# Patient Record
Sex: Male | Born: 1960 | Race: White | Hispanic: No | Marital: Married | Smoking: Never smoker
Health system: Southern US, Community
[De-identification: ages and names within clinical notes are randomized; demographics above are authoritative.]

## PROBLEM LIST (undated history)

## (undated) DIAGNOSIS — K5090 Crohn's disease, unspecified, without complications: Secondary | ICD-10-CM

## (undated) DIAGNOSIS — Z8739 Personal history of other diseases of the musculoskeletal system and connective tissue: Secondary | ICD-10-CM

## (undated) HISTORY — PX: ILEOSTOMY: SHX1783

## (undated) HISTORY — DX: Crohn's disease, unspecified, without complications: K50.90

## (undated) HISTORY — PX: HERNIA REPAIR: SHX51

## (undated) HISTORY — PX: OTHER SURGICAL HISTORY: SHX169

## (undated) HISTORY — PX: VASECTOMY: SHX75

---

## 2011-01-31 HISTORY — PX: COLONOSCOPY: SHX174

## 2012-01-31 HISTORY — PX: FLEXIBLE SIGMOIDOSCOPY: SHX1649

## 2015-01-29 ENCOUNTER — Encounter (INDEPENDENT_AMBULATORY_CARE_PROVIDER_SITE_OTHER): Payer: Self-pay

## 2015-02-03 ENCOUNTER — Ambulatory Visit (INDEPENDENT_AMBULATORY_CARE_PROVIDER_SITE_OTHER): Payer: Federal, State, Local not specified - PPO | Admitting: Internal Medicine

## 2015-02-03 ENCOUNTER — Encounter (INDEPENDENT_AMBULATORY_CARE_PROVIDER_SITE_OTHER): Payer: Self-pay | Admitting: Internal Medicine

## 2015-02-03 VITALS — BP 106/70 | HR 68 | Temp 98.6°F | Resp 18 | Ht 74.0 in | Wt 222.8 lb

## 2015-02-03 DIAGNOSIS — B719 Cestode infection, unspecified: Secondary | ICD-10-CM | POA: Insufficient documentation

## 2015-02-03 DIAGNOSIS — K5010 Crohn's disease of large intestine without complications: Secondary | ICD-10-CM | POA: Insufficient documentation

## 2015-02-03 DIAGNOSIS — K50113 Crohn's disease of large intestine with fistula: Secondary | ICD-10-CM

## 2015-02-03 DIAGNOSIS — A071 Giardiasis [lambliasis]: Secondary | ICD-10-CM | POA: Insufficient documentation

## 2015-02-03 NOTE — Progress Notes (Signed)
Presenting complaint;  For evaluation of recurrent Giardia and tapeworm infestation. History of Crohn's colitis.  History of present illness  Patient is 55 year old Caucasian male who is being evaluated at request of his wife [name] who is an Therapist, sports at [employer]. Patient's primary care physician is Dr. Anastasio Champion. History is provided by the patient and his wife and updated by reviewing EPIC records from tertiary centers. He was able to complete the stool studies as recommended and these are reviewed under lab data.  Patient's present illness began in 2004 when he developed bloody diarrhea and underwent flexible sigmoidoscopy(Dr. Nyoka Cowden in [facility]) and was diagnosed with ulcerative colitis. He was treated with prednisone and oral mesalamine. Prednisone was tapered. He did well for about 2 years when he had to be retreated with prednisone and oral mesalamine(Asacol). His symptoms could not be controlled. Somewhere along the way he developed perianal fistulae and felt to have Crohn's colitis. He was therefore referred to Dr. [name] of [facility]. He was begun on Imuran but within couple of days he developed generalized arthralgias and medication was discontinued. He was retreated with prednisone Cipro and Flagyl. He was also briefly seen by me about 7 years ago while I was in [facility] and was reluctant to be treated with biologics He was finally referred to Dr. [name] of [facility] in August 2010. He underwent colonoscopy on 10/06/2008 revealing normal terminal ileum and patchy colonic disease as well as pseudopolyps and noncritical stricture in ascending colon. Biopsy showed changes consistent with Crohn's disease. He was begun on Remicade and fusion at a dose of 5 mg/kg. Remicade was continued through April 2012 when he developed antibodies and he was switched to Humira. He he recalls that perianal fistulae never healed although symptomatically he was much better. He  underwent flexible sigmoidoscopy in March 2014 and noted to have very active disease. He was retreated with prednisone. Dr. Emelda Fear recommended Vedolizumab but he decided not to go back. Last year he was diagnosed with tapeworm infestation as well as intestinal giardiasis and was treated with praziquantel and albendazole and he also received Tinidazole. He felt much better. Stool frequency decreased. About 2 months ago he started to feel tired in stool frequency increased. He did not experience abdominal pain rectal bleeding fever or chills. When this appointment was made patient was advised to proceed with stool studies for O&P. In the meantime he has been taking herbal medications. And he ordered he feels better. He is having 3-4 bowel movements per day. Perianal fistulae have not healed but drainage has decreased and he has less pain and induration and perianal region. They have dog as a pet and he was last tested 6 months ago. Family history is negative for inflammatory bowel disease. He is on multiple herbal medications and list is not complete. And has not lost any weight recently. He recalls that when he was on vacation in [place] were 10 years ago he became acutely ill and lost 60 pounds in 2 months and wonders if he acquired parasitic infestation then.    Current Medications: Outpatient Encounter Prescriptions as of 02/03/2015  Medication Sig  . Cholecalciferol 2000 units CAPS   . Lactobacillus (PROBIOTIC ACIDOPHILUS PO) Take by mouth daily.  . Omega-3 Fatty Acids (FISH OIL) 1000 MG CAPS 1,000 mg every day Take one capsule by mouth daily   No facility-administered encounter medications on file as of 02/03/2015.   Past medical history:  History of inflammatory bowel disease as  above. History of intestinal giardiasis and tapeworm infection as above. Surgery on right thumb for fracture in 2004 Bilateral vasectomy in 2004 Bilateral inguinal herniorrhaphy in 2012.   Allergies: Allergies   Allergen Reactions  . Imuran [Azathioprine] Swelling    Join pain , rash  . Remicade [Infliximab] Anaphylaxis    Joint pain  . Ppd [Tuberculin Purified Protein Derivative] Swelling    Joint pain. Patient rec'd both this and Imuran, not sure which caused this.     Family history:  Father died of melanoma at age 36 within 1 year of diagnosis. Mother died of lymphoma at age 7 within 2 years of diagnosis. He lost one brother of lung carcinoma at age 27. He has one brother living with 55 and in good health other than being overweight.   Social history:  He is married and have 3 healthy children ages 31, 12 and 59. He works in [employer]. He does not smoke cigarettes or drink alcohol.     Physical examination: Blood pressure 106/70, pulse 68, temperature 98.6 F (37 C), temperature source Oral, resp. rate 18, height 6' 2"  (1.88 m), weight 222 lb 12.8 oz (101.061 kg). Patient is alert and in no acute distress. Conjunctiva is pink. Sclera is nonicteric Oropharyngeal mucosa is normal. No neck masses or thyromegaly noted. Cardiac exam with regular rhythm normal S1 and S2. No murmur or gallop noted. Lungs are clear to auscultation. Abdomen is symmetrical. Bowel sounds are normal. On palpation abdomen is soft and nontender. Sigmoid colon is palpable. No hepatosplenomegaly or masses. Rectal examination was limited to external inspection. There is induration to perianal region to the left of anal opening along with 3 perianal fistula. One is open and the other to work covered with granulation tissue. No LE edema or clubbing noted.  Labs/studies Results: Lab data from 01/20/2015  WBC 7.0  H&H 13.3 and 41.2  Platelet count 391K  Sedimentation rate 33(0-20). Electrolytes within normal limits. BUN 12, creatinine 0.98.  Glucose 118. Bilirubin 0.95, AP 120, AST 20, ALT 20, total protein 8.0 and albumin 3.8.  Serum calcium 9.1.  CRP 3.7(normal less than 1 )  Stool studies  from 01/22/2015(performed at [facility]).  Positive for Giardia and tapeworms.    Assessment:  Cory [name] has to GI issues which are as follows:  #1. Fistula rising Crohn's colitis. He has perianal fistula which have not healed with therapy. He was on Remicade or infliximab for over 3 years and then Humira or adalimumab for over a year. Based on his symptoms is disease activity would appear to be mild to moderate. It remains to be seen if parasitic infestation with 2 organisms considered to be pathogens providing some benefit as for as inflammatory bowel disease is concerned. Treatment options are limited but first need to eradicate both parasites from his GI tract.  #2. Intestinal giardiasis along with tapeworm infestation. Hernia therapy with Helminthic agents did not eradicate these infections. He is presently on herbal combination and appears to be doing better. Since he is doing better will lessen continues therapy for total of 3 cycles and determine if he needs to be retreated with other agents such as Nitazonaxide and metronidazole. Given that he has to parasites in his GI tract one has to wonder about impaired cellular immunity. Will also check HIV with next blood draw.   Plan:  Patient will continue herbal cocktail for total of 3 cycles(10 days on and 5 days off) At which time stool  studies will be repeated and if he still has parasitic infection he will be treated with antibiotics as above. Will also confer with ID specialist with interest in parasitic infestations at tertiary center. Will consider diagnostic colonoscopy as soon as parasitic infestation treated successfully. HIV screening with next blood draw. Patient also advised to get his dog tested for parasitic infestation. Office visit in 2 months.

## 2015-02-03 NOTE — Patient Instructions (Signed)
Continue herbal medications as discussed. Notify if diarrhea relapses or you have abdominal pain or fever.

## 2015-02-04 ENCOUNTER — Encounter (INDEPENDENT_AMBULATORY_CARE_PROVIDER_SITE_OTHER): Payer: Self-pay

## 2015-02-12 ENCOUNTER — Telehealth (INDEPENDENT_AMBULATORY_CARE_PROVIDER_SITE_OTHER): Payer: Self-pay | Admitting: Internal Medicine

## 2015-02-12 NOTE — Telephone Encounter (Signed)
I called microbiology lab in [facility]: Patient's overall and parasite results. It turns out that stool test is negative for ova and parasites. Results given to patient. Patient informed that he does not have parasitic infection. Would recommend proceeding with colonoscopy but willing to treat him with metronidazole which would help with fistulous disease. He will let us know.

## 2015-02-15 ENCOUNTER — Telehealth (INDEPENDENT_AMBULATORY_CARE_PROVIDER_SITE_OTHER): Payer: Self-pay | Admitting: Internal Medicine

## 2015-02-15 NOTE — Telephone Encounter (Signed)
Patient has called , would like to precede with the  Flagyl. Please advise the sig and # to be dispensed and any refills.

## 2015-02-15 NOTE — Telephone Encounter (Signed)
Mrs. Marken left a message saying the pt wants to proceed with having a flagile (sp?) treatment. She'd like a Rx [facility] to [facility] [place] on [address]. Please give them a call if needed.  Thank you.

## 2015-02-16 ENCOUNTER — Other Ambulatory Visit (INDEPENDENT_AMBULATORY_CARE_PROVIDER_SITE_OTHER): Payer: Self-pay | Admitting: *Deleted

## 2015-02-16 MED ORDER — METRONIDAZOLE 250 MG PO TABS: 250.0000 mg | ORAL_TABLET | Freq: Three times a day (TID) | ORAL | Status: DC

## 2015-02-16 NOTE — Telephone Encounter (Signed)
Please call prescription for 250 mg by mouth 3 times a day for 1 month with 1 refill. Office visit in one month

## 2015-02-16 NOTE — Telephone Encounter (Signed)
Flagyl 250 mg has been e-scribed to the patient's pharmacy per Dr.Rehman. Patient will need an OV with NUR in 1 month.  Patient may already have one.

## 2015-02-16 NOTE — Telephone Encounter (Signed)
Has OV 03/30/15

## 2015-02-16 NOTE — Telephone Encounter (Signed)
Per Dr.Rehman call in prescription for the patient. This has been e-scribed to the patient's pharmacy.

## 2015-02-19 ENCOUNTER — Encounter (INDEPENDENT_AMBULATORY_CARE_PROVIDER_SITE_OTHER): Payer: Self-pay

## 2015-03-30 ENCOUNTER — Ambulatory Visit (INDEPENDENT_AMBULATORY_CARE_PROVIDER_SITE_OTHER): Payer: Federal, State, Local not specified - PPO | Admitting: Internal Medicine

## 2015-03-30 ENCOUNTER — Encounter (INDEPENDENT_AMBULATORY_CARE_PROVIDER_SITE_OTHER): Payer: Self-pay | Admitting: Internal Medicine

## 2015-03-30 VITALS — BP 102/80 | HR 70 | Temp 98.3°F | Resp 18 | Ht 74.0 in | Wt 223.3 lb

## 2015-03-30 DIAGNOSIS — Z8619 Personal history of other infectious and parasitic diseases: Secondary | ICD-10-CM

## 2015-03-30 DIAGNOSIS — K50113 Crohn's disease of large intestine with fistula: Secondary | ICD-10-CM

## 2015-03-30 NOTE — Progress Notes (Signed)
Presenting complaint;  Follow-up for Crohn's colitis. History of parasitic infestation.  Subjective:  Cory [name]'s 55 year old Caucasian male whose effort his last visit was on 02/03/2015. Follow-up stool tests were negative for ova and parasites. He finished up 30 days of Flagyl and he also took cocktail of herbal medications. He states he felt much better while he was in herbal therapy. He had more energy less diarrhea and slept better. He did not see any benefit with metronidazole. He is having 3-4 stools per day. Most of her stools are loose and this in pieces. He has occasional formed stool. He denies abdominal pain melena or rectal bleeding. His appetite is good. He has scant amount of drainage from perianal fistulae. He has not lost any weight since his last visit. He had stool testing done by family vet and was told it was negative for Giardia but he still has tape formed larvae. His wife feels his disease is secondary to persistent parasitic infestation. She has been tested and was negative.     Current Medications: Outpatient Encounter Prescriptions as of 03/30/2015  Medication Sig  . Cholecalciferol 2000 units CAPS Take 2,000 capsules by mouth daily.   . Lactobacillus (PROBIOTIC ACIDOPHILUS PO) Take by mouth daily.  Marland Kitchen OLIVE LEAF EXTRACT PO Take by mouth daily.  . Omega-3 Fatty Acids (FISH OIL) 1000 MG CAPS 1,000 mg every day Take one capsule by mouth daily  . [DISCONTINUED] metroNIDAZOLE (FLAGYL) 250 MG tablet Take 1 tablet (250 mg total) by mouth 3 (three) times daily. (Patient not taking: Reported on 03/30/2015)   No facility-administered encounter medications on file as of 03/30/2015.     Objective: Blood pressure 102/80, pulse 70, temperature 98.3 F (36.8 C), temperature source Oral, resp. rate 18, height 6' 2"  (1.88 m), weight 223 lb 4.8 oz (101.288 kg). Patient is alert and in no acute distress. Conjunctiva is pink. Sclera is nonicteric Oropharyngeal mucosa is normal. No  neck masses or thyromegaly noted. Cardiac exam with regular rhythm normal S1 and S2. No murmur or gallop noted. Lungs are clear to auscultation. Abdomen is symmetrical soft and nontender without organomegaly or masses. Rectal examination was limited to external inspection. He has indurated area to the left of anal opening knee has 4 perianal openings with granulation tissue. There is a mucopurulent discharge on the left side. No fluctuant mass palpated. No LE edema or clubbing noted.  Labs/studies Results: Stool test from 2015/01/23  Negative for over and parasites.    Assessment:  #1. Fistulas and Crohn's colitis. He remains with mild to moderate symptoms. He has failed multiple treatment modalities including oral mesalamine, 6-MP and 2 biologics. I do not believe his illness can be explained on the basis of parasitic infestation. Patient needs to undergo diagnostic colonoscopy before another biologic considered. #2. History of Giardia and tapeworm infestation. Stool test was negative at [facility] lab. However repeat stool testing by his dogs vet is positive for tapeworm larvae and Giardia. I do not believe he has recurrent Helminthic infestation. Will repeat stool test guaiac [facility] lab. Since patient and his wife are convinced that he has persistent parasitic infestation he may have to be referred to yet another tertiary center.   Plan:  Stool for O&P via [facility] lab.

## 2015-03-30 NOTE — Patient Instructions (Signed)
Physician will call with results of stool studies when completed

## 2015-03-31 ENCOUNTER — Encounter (INDEPENDENT_AMBULATORY_CARE_PROVIDER_SITE_OTHER): Payer: Self-pay | Admitting: Internal Medicine

## 2015-04-09 ENCOUNTER — Other Ambulatory Visit (HOSPITAL_COMMUNITY)
Admission: RE | Admit: 2015-04-09 | Discharge: 2015-04-09 | Disposition: A | Discharge status: Home / Self Care | Payer: Federal, State, Local not specified - PPO | Source: Ambulatory Visit | Attending: Internal Medicine | Admitting: Internal Medicine

## 2015-04-09 DIAGNOSIS — Z8619 Personal history of other infectious and parasitic diseases: Secondary | ICD-10-CM | POA: Diagnosis not present

## 2015-04-28 ENCOUNTER — Telehealth (INDEPENDENT_AMBULATORY_CARE_PROVIDER_SITE_OTHER): Payer: Self-pay | Admitting: Internal Medicine

## 2015-04-28 NOTE — Telephone Encounter (Signed)
Patient was called and advised that the specimen is in process per the lab (epic).

## 2015-04-28 NOTE — Telephone Encounter (Signed)
[name], Mr. Hogans's wife left a message saying he's not received the results to the stool sample that was dropped off two weeks ago and she's wondering if he can get them. In the message she mentioned dropping the sample off at the hospital and I'm not sure if she was confused between the hospital and bringing it to the office. Please call the patient if possible.   Pt's ph# [phone]  Thank you.

## 2015-05-04 LAB — OVA + PARASITE EXAM

## 2015-05-12 ENCOUNTER — Encounter (INDEPENDENT_AMBULATORY_CARE_PROVIDER_SITE_OTHER): Payer: Self-pay

## 2015-06-17 ENCOUNTER — Other Ambulatory Visit (INDEPENDENT_AMBULATORY_CARE_PROVIDER_SITE_OTHER): Payer: Self-pay | Admitting: Internal Medicine

## 2015-06-17 MED ORDER — METRONIDAZOLE 500 MG PO TABS: 500.0000 mg | ORAL_TABLET | Freq: Three times a day (TID) | ORAL | Status: DC

## 2015-07-13 ENCOUNTER — Encounter (INDEPENDENT_AMBULATORY_CARE_PROVIDER_SITE_OTHER): Payer: Self-pay | Admitting: Internal Medicine

## 2015-07-13 ENCOUNTER — Ambulatory Visit (INDEPENDENT_AMBULATORY_CARE_PROVIDER_SITE_OTHER): Payer: Federal, State, Local not specified - PPO | Admitting: Internal Medicine

## 2015-07-13 VITALS — BP 108/70 | HR 72 | Temp 98.4°F | Resp 18 | Ht 74.0 in | Wt 217.9 lb

## 2015-07-13 DIAGNOSIS — K50113 Crohn's disease of large intestine with fistula: Secondary | ICD-10-CM

## 2015-07-13 DIAGNOSIS — J4530 Mild persistent asthma, uncomplicated: Secondary | ICD-10-CM

## 2015-07-13 MED ORDER — METHYLPREDNISOLONE 4 MG PO TBPK: ORAL_TABLET | ORAL | Status: DC

## 2015-07-13 NOTE — Progress Notes (Signed)
Presenting complaint;  Follow-up for fistulizing Crohn's disease.  Subjective:  Cory [name] is 55 year old Caucasian male who is here for scheduled visit complete by his wife Cory [name]. He was last seen on 1 03/30/2015. He had stool O&P and was negative. He was called in metronidazole prescription last week which he took for 2 weeks and noted decrease in drainage from perianal fistulae. However his symptoms relapse every few weeks. He develops a boil and he gets relief for any ruptures and this cycle carries on. He is having 3-4 stools per day. Most of his stools are soft. Some stools are loose. He denies frank bleeding or melena. He also denies abdominal pain fever or chills. He has good appetite. He has lost 6 pounds since his last visit. He states he is changes eating habits when he found out his LDL was 171. He complains of dry hacking cough of 3 weeks' duration. He was seen in [facility] and given doxycycline. However he is not feeling any better. He denies heartburn dysphagia or regurgitation. 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 He works with [employer].   Current Medications: Outpatient Encounter Prescriptions as of 07/13/2015  Medication Sig  . Cholecalciferol 2000 units CAPS Take 5,000 capsules by mouth daily.   Marland Kitchen doxycycline (VIBRA-TABS) 100 MG tablet TAKE 1 TABLET BY MOUTH TWICE A DAY FOR 14 DAYS  . fluticasone (FLONASE) 50 MCG/ACT nasal spray Place into both nostrils daily.   . Lactobacillus (PROBIOTIC ACIDOPHILUS PO) Take by mouth daily.  . Omega-3 Fatty Acids (FISH OIL) 1000 MG CAPS 1,000 mg every day Take one capsule by mouth daily  . metroNIDAZOLE (FLAGYL) 500 MG tablet Take 1 tablet (500 mg total) by mouth 3 (three) times daily. (Patient not taking: Reported on 07/13/2015)  . OLIVE LEAF EXTRACT PO Take by mouth daily. Reported on 07/13/2015   No facility-administered encounter medications on file as of 07/13/2015.      Objective: Blood pressure 108/70, pulse 72, temperature 98.4 F (36.9 C), temperature source Oral, resp. rate 18, height 6' 2"  (1.88 m), weight 217 lb 14.4 oz (98.839 kg). Patient is alert and in no acute distress. Conjunctiva is pink. Sclera is nonicteric Oropharyngeal mucosa is normal. No neck masses or thyromegaly noted. Cardiac exam with regular rhythm normal S1 and S2. No murmur or gallop noted. Lungs are clear to auscultation. Abdomen is symmetrical soft and nontender without organomegaly or masses.  No LE edema or clubbing noted.  Labs/studies Results:   stool O&P report to be negative on 04/16/2015.   Assessment:  #1. Fistulizing Crohn's disease. He remains symptomatic but luckily he is coping well with the symptoms. Two week course of metronidazole provided transient relief. He has been on multiple therapies and unfortunately none have provided sustained benefit. #2. Cough of 3 weeks duration possibly due to allergic bronchitis. He has not responded to doxycycline. #3. History of parasitic infestation. Stool O&P negative from March 2017.   Plan:  Discontinue doxycycline. Medrol dose pack as directed. Claritin or Zyrtec 10 mg by mouth daily. MR enterography to determine disease extent and outline fistulae. Will schedule diagnostic colonoscopy once MR completed and reviewed.

## 2015-07-13 NOTE — Patient Instructions (Signed)
Discontinue doxycycline. MR enterography to be scheduled. Patient will call with results when study completed

## 2015-07-15 ENCOUNTER — Other Ambulatory Visit (INDEPENDENT_AMBULATORY_CARE_PROVIDER_SITE_OTHER): Payer: Self-pay | Admitting: Internal Medicine

## 2015-07-15 DIAGNOSIS — K5010 Crohn's disease of large intestine without complications: Secondary | ICD-10-CM

## 2015-07-22 ENCOUNTER — Ambulatory Visit (HOSPITAL_COMMUNITY): Admission: RE | Admit: 2015-07-22 | Payer: Federal, State, Local not specified - PPO | Source: Ambulatory Visit

## 2015-07-26 ENCOUNTER — Ambulatory Visit (HOSPITAL_COMMUNITY)
Admission: RE | Admit: 2015-07-26 | Discharge: 2015-07-26 | Disposition: A | Discharge status: Home / Self Care | Payer: Federal, State, Local not specified - PPO | Source: Ambulatory Visit | Attending: Internal Medicine | Admitting: Internal Medicine

## 2015-07-26 DIAGNOSIS — K50113 Crohn's disease of large intestine with fistula: Secondary | ICD-10-CM

## 2015-07-26 DIAGNOSIS — K5010 Crohn's disease of large intestine without complications: Secondary | ICD-10-CM | POA: Diagnosis not present

## 2015-07-26 MED ORDER — GLUCAGON HCL RDNA (DIAGNOSTIC) 1 MG IJ SOLR
INTRAMUSCULAR | Status: AC
  Administered 2015-07-26: 0.5 mg via INTRAVENOUS
  Filled 2015-07-26: qty 1

## 2015-07-26 MED ORDER — BARIUM SULFATE 0.1 % PO SUSP
ORAL | Status: AC
  Filled 2015-07-26: qty 3

## 2015-07-26 MED ORDER — GLUCAGON HCL RDNA (DIAGNOSTIC) 1 MG IJ SOLR
0.5000 mg | Freq: Once | INTRAMUSCULAR | Status: AC
  Administered 2015-07-26: 0.5 mg via INTRAVENOUS

## 2015-07-26 MED ORDER — GLUCAGON HCL RDNA (DIAGNOSTIC) 1 MG IJ SOLR: 0.5000 mg | Freq: Once | INTRAMUSCULAR | Status: DC

## 2015-07-26 MED ORDER — GADOBENATE DIMEGLUMINE 529 MG/ML IV SOLN
20.0000 mL | Freq: Once | INTRAVENOUS | Status: AC | PRN
  Administered 2015-07-26: 20 mL via INTRAVENOUS

## 2015-07-28 MED ORDER — GADOBENATE DIMEGLUMINE 529 MG/ML IV SOLN: 20.0000 mL | Freq: Once | INTRAVENOUS | Status: DC | PRN

## 2015-07-29 ENCOUNTER — Telehealth (INDEPENDENT_AMBULATORY_CARE_PROVIDER_SITE_OTHER): Payer: Self-pay | Admitting: Internal Medicine

## 2015-07-29 NOTE — Telephone Encounter (Signed)
Patient's spouse called to see if we have the patient's MRI results back.  [phone]

## 2015-07-29 NOTE — Telephone Encounter (Signed)
I have called the patient's wife at the number provided. Rec'd voicemail. Left message asking that she call me back.

## 2015-08-04 ENCOUNTER — Other Ambulatory Visit (INDEPENDENT_AMBULATORY_CARE_PROVIDER_SITE_OTHER): Payer: Self-pay | Admitting: *Deleted

## 2015-08-04 ENCOUNTER — Encounter (INDEPENDENT_AMBULATORY_CARE_PROVIDER_SITE_OTHER): Payer: Self-pay | Admitting: *Deleted

## 2015-08-04 DIAGNOSIS — K50919 Crohn's disease, unspecified, with unspecified complications: Secondary | ICD-10-CM

## 2015-08-04 NOTE — Telephone Encounter (Signed)
Patient needs trilyte 

## 2015-08-05 MED ORDER — PEG 3350-KCL-NA BICARB-NACL 420 G PO SOLR: 4000.0000 mL | Freq: Once | ORAL | Status: DC

## 2015-08-09 ENCOUNTER — Telehealth (INDEPENDENT_AMBULATORY_CARE_PROVIDER_SITE_OTHER): Payer: Self-pay | Admitting: Internal Medicine

## 2015-08-09 ENCOUNTER — Other Ambulatory Visit (INDEPENDENT_AMBULATORY_CARE_PROVIDER_SITE_OTHER): Payer: Self-pay | Admitting: Internal Medicine

## 2015-08-09 ENCOUNTER — Other Ambulatory Visit (HOSPITAL_COMMUNITY)
Admission: RE | Admit: 2015-08-09 | Discharge: 2015-08-09 | Disposition: A | Discharge status: Home / Self Care | Payer: Federal, State, Local not specified - PPO | Source: Ambulatory Visit | Attending: Internal Medicine | Admitting: Internal Medicine

## 2015-08-09 DIAGNOSIS — R197 Diarrhea, unspecified: Secondary | ICD-10-CM

## 2015-08-09 LAB — C DIFFICILE QUICK SCREEN W PCR REFLEX
C DIFFICLE (CDIFF) ANTIGEN: NEGATIVE
C Diff interpretation: NOT DETECTED
C Diff toxin: NEGATIVE

## 2015-08-09 NOTE — Telephone Encounter (Signed)
Patient's wife called and stated that she spoke to Dr. Laural Golden on Saturday and that Dr. Laural Golden called [facility] in some medication, Cipro and Flagyl.  She stated that she failed to tell Dr. Laural Golden that he had been on antibiotics for pneumonia.  He is now having diarrhea.  She is very concerned that he may have c-diff.  She is working in the ER today and brought a sample with her.  She would like to know if he needs to be seen or if an order can be given to test the sample.  [name] - ER [phone] [place] [place] [phone]

## 2015-08-09 NOTE — Telephone Encounter (Signed)
Please send order for cdiff and let patient's wife know

## 2015-08-09 NOTE — Telephone Encounter (Signed)
Patient wife aware

## 2015-08-09 NOTE — Telephone Encounter (Signed)
Cory [name] has addressed this

## 2015-08-19 ENCOUNTER — Encounter (INDEPENDENT_AMBULATORY_CARE_PROVIDER_SITE_OTHER): Payer: Self-pay | Admitting: Internal Medicine

## 2015-08-19 ENCOUNTER — Ambulatory Visit (INDEPENDENT_AMBULATORY_CARE_PROVIDER_SITE_OTHER): Payer: Federal, State, Local not specified - PPO | Admitting: Internal Medicine

## 2015-08-19 VITALS — BP 124/86 | HR 72 | Temp 98.0°F | Ht 74.0 in | Wt 207.6 lb

## 2015-08-19 DIAGNOSIS — K50919 Crohn's disease, unspecified, with unspecified complications: Secondary | ICD-10-CM | POA: Diagnosis not present

## 2015-08-19 NOTE — Progress Notes (Addendum)
Subjective:    Patient ID: Cory [name], male    DOB: 1960/07/18, 55 y.o.   MRN: [id number]  HPIHere today with c/o diarrhea. He is having diarrhea about 5 times a day. He says the diarrhea looks like a milk shake. Diarrhea started 2 weeks ago after Rocephin IV and ?Cefdinir at a Urgent Care. 08/09/2015 C diff negative. He says he feels weak. No fever. He says he had a fever last weekend. He is trying to eat. He says he has lost weight. He has lost 10 pounds since his OV in June.  He was last seen by Dr Laural Golden in June. Stool O and P were negative. He had a negative MRI. Hx of perianal fistulae. Placed on Cipro and Flagyl and Nystatin cream by Dr. Laural Golden. Scheduled for a colonoscopy 09/03/2015.       Diagnosed with UC by Dr. Nyoka Cowden in [place] in 2004.  He was treated with Prednisone and oral mesalamine. He developed a perianal fistulae and he was felt to have Crohn's collitis.  He was referred to Dr. [name] at [facility]. He was started on Imura burt developed arthralgias and the medication was discontinued.  Referred to Dr. [name] of [facility] in August 2010. He underwent colonoscopy on 10/06/2008 revealing normal terminal ileum and patchy colonic disease as well as pseudopolyps and noncritical stricture in ascending colon. Biopsy showed changes consistent with Crohn's disease. He was begun on Remicade and fusion at a dose of 5 mg/kg. Remicade was continued through April 2012 when he developed antibodies and he was switched to Humira. He he recalls that perianal fistulae never healed although symptomatically he was much better. He underwent flexible sigmoidoscopy in March 2014 and noted to have very active disease. He was retreated with prednisone. Dr. Emelda Fear recommended Vedolizumab but he decided not to go back. Last year he was diagnosed with tapeworm infestation as well as intestinal giardiasis and was treated with praziquantel and albendazole and he also received Tinidazole. He  felt much better. Stool frequency decreased. (Per Dr. Olevia Perches records).   Review of Systems Past Medical History  Diagnosis Date  . Crohn's disease [facility])     Past Surgical History  Procedure Laterality Date  . Right thumb      Compound Surgery  . Vasectomy    . Hernia repair      bilateral with mesh  . Colonoscopy  2013  . Flexible sigmoidoscopy  01/2012    Allergies  Allergen Reactions  . Imuran [Azathioprine] Swelling    Join pain , rash  . Remicade [Infliximab] Anaphylaxis    Joint pain  . Ppd [Tuberculin Purified Protein Derivative] Swelling    Joint pain. Patient rec'd both this and Imuran, not sure which caused this.    Current Outpatient Prescriptions on File Prior to Visit  Medication Sig Dispense Refill  . Cholecalciferol 2000 units CAPS Take 5,000 capsules by mouth daily.     . Lactobacillus (PROBIOTIC ACIDOPHILUS PO) Take by mouth daily.    . metroNIDAZOLE (FLAGYL) 500 MG tablet Take 1 tablet (500 mg total) by mouth 3 (three) times daily. 42 tablet 1  . fluticasone (FLONASE) 50 MCG/ACT nasal spray Place into both nostrils daily. Reported on 08/19/2015  0  . methylPREDNISolone (MEDROL DOSEPAK) 4 MG TBPK tablet Use as directed (Patient not taking: Reported on 08/19/2015) 21 tablet 0  . OLIVE LEAF EXTRACT PO Take by mouth daily. Reported on 08/19/2015    . Omega-3 Fatty Acids (FISH OIL) 1000 MG  CAPS Reported on 08/19/2015    . polyethylene glycol-electrolytes (TRILYTE) 420 g solution Take 4,000 mLs by mouth once. (Patient not taking: Reported on 08/19/2015) 4000 mL 0   Current Facility-Administered Medications on File Prior to Visit  Medication Dose Route Frequency Provider Last Rate Last Dose  . gadobenate dimeglumine (MULTIHANCE) injection 20 mL  20 mL Intravenous Once PRN Rogene Houston, MD            Objective:   Physical ExamBlood pressure 124/86, pulse 72, temperature 98 F (36.7 C), height 6' 2"  (1.88 m), weight 207 lb 9.6 oz (94.167 kg). Alert and  oriented. Skin warm and dry. Oral mucosa is moist.   . Sclera anicteric, conjunctivae is pink. Thyroid not enlarged. No cervical lymphadenopathy. Lungs clear. Heart regular rate and rhythm.  Abdomen is soft. Bowel sounds are positive. No hepatomegaly. No abdominal masses felt. No tenderness.  No edema to lower extremities.          Assessment & Plan:  Diarrhea ? Antibiotic induced: Presently taking Cipro and Flagyl. Continue. Will get a CBC, Cmet, and sedrate.   . Fistulizing Crohn's colitis. He has perianal fistula which have not healed with therapy. Recent MRI negative. Scheduled for a colonoscopy in August.

## 2015-08-19 NOTE — Progress Notes (Signed)
Subjective:    Patient ID: Cory [name], male    DOB: 18-Nov-1960, 55 y.o.   MRN: [id number]  HPIHere today with c/o diarrhea. He is having diarrhea about 5 times a day. He says the diarrhea looks like a milk shake. Diarrhea started 2 weeks ago after Rocephin IV and Cefnifir at a Urgent Care. 08/09/2015 C diff negative. He says he feels weak. No fever. He says he had a fever last weekend. He is trying to eat. He says he has lost weight. He has lost 10 pounds since his OV in June.  He was last seen by Dr Laural Golden in June. Stool O and P were negative. He had a negative MRI. Hx of perianal fistulae. Placed on Cipro and Flagyl and Nystatin cream by Dr. Laural Golden. Scheduled for a colonoscopy 09/03/2015.       Diagnosed with UC by Dr. Nyoka Cowden in [place] in 2004.  He was treated with Prednisone and oral mesalamine. He developed a perianal fistulae and he was felt to have Crohn's collitis.  He was referred to Dr. [name] at [facility]. He was started on Imura burt developed arthralgias and the medication was discontinued.  Referred to Dr. [name] of [facility] in August 2010. He underwent colonoscopy on 10/06/2008 revealing normal terminal ileum and patchy colonic disease as well as pseudopolyps and noncritical stricture in ascending colon. Biopsy showed changes consistent with Crohn's disease. He was begun on Remicade and fusion at a dose of 5 mg/kg. Remicade was continued through April 2012 when he developed antibodies and he was switched to Humira. He he recalls that perianal fistulae never healed although symptomatically he was much better. He underwent flexible sigmoidoscopy in March 2014 and noted to have very active disease. He was retreated with prednisone. Dr. Emelda Fear recommended Vedolizumab but he decided not to go back. Last year he was diagnosed with tapeworm infestation as well as intestinal giardiasis and was treated with praziquantel and albendazole and he also received Tinidazole. He felt  much better. Stool frequency decreased. (Per Dr. Olevia Perches records).   Review of Systems Past Medical History  Diagnosis Date  . Crohn's disease [facility])     Past Surgical History  Procedure Laterality Date  . Right thumb      Compound Surgery  . Vasectomy    . Hernia repair      bilateral with mesh  . Colonoscopy  2013  . Flexible sigmoidoscopy  01/2012    Allergies  Allergen Reactions  . Imuran [Azathioprine] Swelling    Join pain , rash  . Remicade [Infliximab] Anaphylaxis    Joint pain  . Ppd [Tuberculin Purified Protein Derivative] Swelling    Joint pain. Patient rec'd both this and Imuran, not sure which caused this.    Current Outpatient Prescriptions on File Prior to Visit  Medication Sig Dispense Refill  . Cholecalciferol 2000 units CAPS Take 5,000 capsules by mouth daily.     . Lactobacillus (PROBIOTIC ACIDOPHILUS PO) Take by mouth daily.    . metroNIDAZOLE (FLAGYL) 500 MG tablet Take 1 tablet (500 mg total) by mouth 3 (three) times daily. 42 tablet 1  . fluticasone (FLONASE) 50 MCG/ACT nasal spray Place into both nostrils daily. Reported on 08/19/2015  0  . methylPREDNISolone (MEDROL DOSEPAK) 4 MG TBPK tablet Use as directed (Patient not taking: Reported on 08/19/2015) 21 tablet 0  . OLIVE LEAF EXTRACT PO Take by mouth daily. Reported on 08/19/2015    . Omega-3 Fatty Acids (FISH OIL) 1000 MG  CAPS Reported on 08/19/2015    . polyethylene glycol-electrolytes (TRILYTE) 420 g solution Take 4,000 mLs by mouth once. (Patient not taking: Reported on 08/19/2015) 4000 mL 0   Current Facility-Administered Medications on File Prior to Visit  Medication Dose Route Frequency Provider Last Rate Last Dose  . gadobenate dimeglumine (MULTIHANCE) injection 20 mL  20 mL Intravenous Once PRN Rogene Houston, MD            Objective:   Physical ExamBlood pressure 124/86, pulse 72, temperature 98 F (36.7 C), height 6' 2"  (1.88 m), weight 207 lb 9.6 oz (94.167 kg). Alert and oriented.  Skin warm and dry. Oral mucosa is moist.   . Sclera anicteric, conjunctivae is pink. Thyroid not enlarged. No cervical lymphadenopathy. Lungs clear. Heart regular rate and rhythm.  Abdomen is soft. Bowel sounds are positive. No hepatomegaly. No abdominal masses felt. No tenderness.  No edema to lower extremities.          Assessment & Plan:  Diarrhea ? Antibiotic induced: Presently taking Cipro and Flagyl. Continue. Will get a CBC, Cmet, and sedrate.   . Fistula rising Crohn's colitis. He has perianal fistula which have not healed with therapy. Recent MRI negative. Scheduled for a colonoscopy in August.

## 2015-08-19 NOTE — Patient Instructions (Signed)
CBC, CMET, sedrate.

## 2015-08-20 ENCOUNTER — Other Ambulatory Visit (INDEPENDENT_AMBULATORY_CARE_PROVIDER_SITE_OTHER): Payer: Self-pay | Admitting: Internal Medicine

## 2015-08-20 LAB — CBC WITH DIFFERENTIAL/PLATELET
BASOS ABS: 0 {cells}/uL (ref 0–200)
Basophils Relative: 0 %
EOS ABS: 95 {cells}/uL (ref 15–500)
Eosinophils Relative: 1 %
HEMATOCRIT: 40.5 % (ref 38.5–50.0)
HEMOGLOBIN: 13.5 g/dL (ref 13.2–17.1)
LYMPHS PCT: 15 %
Lymphs Abs: 1425 cells/uL (ref 850–3900)
MCH: 27 pg (ref 27.0–33.0)
MCHC: 33.3 g/dL (ref 32.0–36.0)
MCV: 81 fL (ref 80.0–100.0)
MONO ABS: 1140 {cells}/uL — AB (ref 200–950)
MPV: 8 fL (ref 7.5–12.5)
Monocytes Relative: 12 %
NEUTROS PCT: 72 %
Neutro Abs: 6840 cells/uL (ref 1500–7800)
Platelets: 605 10*3/uL — ABNORMAL HIGH (ref 140–400)
RBC: 5 MIL/uL (ref 4.20–5.80)
RDW: 15.3 % — AB (ref 11.0–15.0)
WBC: 9.5 10*3/uL (ref 3.8–10.8)

## 2015-08-20 LAB — COMPREHENSIVE METABOLIC PANEL
ALT: 6 U/L — ABNORMAL LOW (ref 9–46)
AST: 9 U/L — ABNORMAL LOW (ref 10–35)
Albumin: 3.4 g/dL — ABNORMAL LOW (ref 3.6–5.1)
Alkaline Phosphatase: 64 U/L (ref 40–115)
BUN: 14 mg/dL (ref 7–25)
CHLORIDE: 101 mmol/L (ref 98–110)
CO2: 23 mmol/L (ref 20–31)
Calcium: 8.9 mg/dL (ref 8.6–10.3)
Creat: 1.31 mg/dL (ref 0.70–1.33)
Glucose, Bld: 76 mg/dL (ref 65–99)
POTASSIUM: 4.7 mmol/L (ref 3.5–5.3)
Sodium: 138 mmol/L (ref 135–146)
TOTAL PROTEIN: 7.9 g/dL (ref 6.1–8.1)
Total Bilirubin: 0.4 mg/dL (ref 0.2–1.2)

## 2015-08-20 LAB — SEDIMENTATION RATE: Sed Rate: 77 mm/hr — ABNORMAL HIGH (ref 0–20)

## 2015-08-24 ENCOUNTER — Telehealth (INDEPENDENT_AMBULATORY_CARE_PROVIDER_SITE_OTHER): Payer: Self-pay | Admitting: Internal Medicine

## 2015-08-24 NOTE — Telephone Encounter (Signed)
Patient called and would like his results.

## 2015-08-25 NOTE — Telephone Encounter (Signed)
Patient called returning your call

## 2015-08-25 NOTE — Telephone Encounter (Signed)
Message left on answering machine

## 2015-08-30 ENCOUNTER — Other Ambulatory Visit (HOSPITAL_COMMUNITY): Payer: Federal, State, Local not specified - PPO

## 2015-08-31 ENCOUNTER — Telehealth (INDEPENDENT_AMBULATORY_CARE_PROVIDER_SITE_OTHER): Payer: Self-pay | Admitting: Internal Medicine

## 2015-08-31 DIAGNOSIS — R197 Diarrhea, unspecified: Secondary | ICD-10-CM

## 2015-08-31 NOTE — Telephone Encounter (Signed)
Stools are still loose. Am going to order a GI pathogen.

## 2015-08-31 NOTE — Telephone Encounter (Signed)
Am going to get a GI pathogen.

## 2015-09-02 ENCOUNTER — Encounter (INDEPENDENT_AMBULATORY_CARE_PROVIDER_SITE_OTHER): Payer: Self-pay | Admitting: *Deleted

## 2015-09-02 ENCOUNTER — Other Ambulatory Visit (INDEPENDENT_AMBULATORY_CARE_PROVIDER_SITE_OTHER): Payer: Self-pay | Admitting: *Deleted

## 2015-09-02 DIAGNOSIS — K50918 Crohn's disease, unspecified, with other complication: Secondary | ICD-10-CM

## 2015-09-03 ENCOUNTER — Ambulatory Visit (HOSPITAL_COMMUNITY)
Admission: RE | Admit: 2015-09-03 | Payer: Federal, State, Local not specified - PPO | Source: Ambulatory Visit | Admitting: Internal Medicine

## 2015-09-03 ENCOUNTER — Encounter (HOSPITAL_COMMUNITY): Admission: RE | Payer: Self-pay | Source: Ambulatory Visit

## 2015-09-03 SURGERY — COLONOSCOPY WITH PROPOFOL
Anesthesia: Monitor Anesthesia Care

## 2015-09-13 ENCOUNTER — Telehealth (INDEPENDENT_AMBULATORY_CARE_PROVIDER_SITE_OTHER): Payer: Self-pay | Admitting: *Deleted

## 2015-09-13 NOTE — Telephone Encounter (Signed)
[name] stopped by, if you want Duanne to have repeat C-diff they need an order, she states we can mail it

## 2015-09-14 ENCOUNTER — Telehealth (INDEPENDENT_AMBULATORY_CARE_PROVIDER_SITE_OTHER): Payer: Self-pay | Admitting: Internal Medicine

## 2015-09-14 NOTE — Telephone Encounter (Signed)
No answer at home. Will call later.

## 2015-09-14 NOTE — Telephone Encounter (Signed)
Order for GI pathogen given to Tammy for her to mail to their house

## 2015-10-06 ENCOUNTER — Other Ambulatory Visit (INDEPENDENT_AMBULATORY_CARE_PROVIDER_SITE_OTHER): Payer: Self-pay | Admitting: Internal Medicine

## 2015-10-06 ENCOUNTER — Encounter (INDEPENDENT_AMBULATORY_CARE_PROVIDER_SITE_OTHER): Payer: Self-pay | Admitting: *Deleted

## 2015-10-06 DIAGNOSIS — K219 Gastro-esophageal reflux disease without esophagitis: Secondary | ICD-10-CM | POA: Insufficient documentation

## 2015-10-06 DIAGNOSIS — K50119 Crohn's disease of large intestine with unspecified complications: Secondary | ICD-10-CM

## 2015-10-06 DIAGNOSIS — R197 Diarrhea, unspecified: Secondary | ICD-10-CM | POA: Insufficient documentation

## 2015-10-14 ENCOUNTER — Encounter (INDEPENDENT_AMBULATORY_CARE_PROVIDER_SITE_OTHER): Payer: Self-pay | Admitting: *Deleted

## 2015-10-14 ENCOUNTER — Telehealth (INDEPENDENT_AMBULATORY_CARE_PROVIDER_SITE_OTHER): Payer: Self-pay | Admitting: *Deleted

## 2015-10-14 ENCOUNTER — Other Ambulatory Visit (INDEPENDENT_AMBULATORY_CARE_PROVIDER_SITE_OTHER): Payer: Self-pay | Admitting: Internal Medicine

## 2015-10-14 NOTE — Telephone Encounter (Signed)
Patient needs movi prep 

## 2015-10-15 ENCOUNTER — Other Ambulatory Visit (INDEPENDENT_AMBULATORY_CARE_PROVIDER_SITE_OTHER): Payer: Self-pay | Admitting: *Deleted

## 2015-10-15 MED ORDER — METRONIDAZOLE 500 MG PO TABS: 500.0000 mg | ORAL_TABLET | Freq: Three times a day (TID) | ORAL | 0 refills | Status: DC

## 2015-10-15 NOTE — Telephone Encounter (Signed)
Patients refill request was done.

## 2015-10-19 MED ORDER — PEG-KCL-NACL-NASULF-NA ASC-C 100 G PO SOLR: 1.0000 | Freq: Once | ORAL | 0 refills | Status: AC

## 2015-10-22 ENCOUNTER — Encounter (INDEPENDENT_AMBULATORY_CARE_PROVIDER_SITE_OTHER): Payer: Self-pay

## 2015-11-15 ENCOUNTER — Other Ambulatory Visit (HOSPITAL_COMMUNITY): Payer: Federal, State, Local not specified - PPO

## 2015-11-15 NOTE — Patient Instructions (Signed)
"   "        Cory [name]  11/15/2015     @[email or web address] @   Your procedure is scheduled on  11/19/2015   Report to [facility] at  800  A.M.  Call this number if you have problems the morning of surgery:  [phone]   Remember:  Do not eat food or drink liquids after midnight.  Take these medicines the morning of surgery with A SIP OF WATER  claritin.   Do not wear jewelry, make-up or nail polish.  Do not wear lotions, powders, or perfumes, or deoderant.  Do not shave 48 hours prior to surgery.  Men may shave face and neck.  Do not bring valuables to the hospital.  [facility] is not responsible for any belongings or valuables.  Contacts, dentures or bridgework may not be worn into surgery.  Leave your suitcase in the car.  After surgery it may be brought to your room.  For patients admitted to the hospital, discharge time will be determined by your treatment team.  Patients discharged the day of surgery will not be allowed to drive home.   Name and phone number of your driver:   family Special instructions:  Follow the diet and prep instructions given to you by Dr Olevia Perches office.  Please read over the following fact sheets that you were given. Anesthesia Post-op Instructions and Care and Recovery After Surgery      Esophagogastroduodenoscopy Esophagogastroduodenoscopy (EGD) is a procedure that is used to examine the lining of the esophagus, stomach, and first part of the small intestine (duodenum). A long, flexible, lighted tube with a camera attached (endoscope) is inserted down the throat to view these organs. This procedure is done to detect problems or abnormalities, such as inflammation, bleeding, ulcers, or growths, in order to treat them. The procedure lasts 5-20 minutes. It is usually an outpatient procedure, but it may need to be performed in a hospital in emergency cases. LET [facility] CARE PROVIDER KNOW ABOUT:  Any allergies you  have.  All medicines you are taking, including vitamins, herbs, eye drops, creams, and over-the-counter medicines.  Previous problems you or members of your family have had with the use of anesthetics.  Any blood disorders you have.  Previous surgeries you have had.  Medical conditions you have. RISKS AND COMPLICATIONS Generally, this is a safe procedure. However, problems can occur and include:  Infection.  Bleeding.  Tearing (perforation) of the esophagus, stomach, or duodenum.  Difficulty breathing or not being able to breathe.  Excessive sweating.  Spasms of the larynx.  Slowed heartbeat.  Low blood pressure. BEFORE THE PROCEDURE  Do not eat or drink anything after midnight on the night before the procedure or as directed by your health care provider.  Do not take your regular medicines before the procedure if your health care provider asks you not to. Ask your health care provider about changing or stopping those medicines.  If you wear dentures, be prepared to remove them before the procedure.  Arrange for someone to drive you home after the procedure. PROCEDURE  A numbing medicine (local anesthetic) may be sprayed in your throat for comfort and to stop you from gagging or coughing.  You will have an IV tube inserted in a vein in your hand or arm. You will receive medicines and fluids through this tube.  You will be given a medicine to relax you (sedative).  A pain reliever will  be given through the IV tube.  A mouth guard may be placed in your mouth to protect your teeth and to keep you from biting on the endoscope.  You will be asked to lie on your left side.  The endoscope will be inserted down your throat and into your esophagus, stomach, and duodenum.  Air will be put through the endoscope to allow your health care provider to clearly view the lining of your esophagus.  The lining of your esophagus, stomach, and duodenum will be examined. During the  exam, your health care provider may:  Remove tissue to be examined under a microscope (biopsy) for inflammation, infection, or other medical problems.  Remove growths.  Remove objects (foreign bodies) that are stuck.  Treat any bleeding with medicines or other devices that stop tissues from bleeding (hot cautery, clipping devices).  Widen (dilate) or stretch narrowed areas of your esophagus and stomach.  The endoscope will be withdrawn. AFTER THE PROCEDURE  You will be taken to a recovery area for observation. Your blood pressure, heart rate, breathing rate, and blood oxygen level will be monitored often until the medicines you were given have worn off.  Do not eat or drink anything until the numbing medicine has worn off and your gag reflex has returned. You may choke.  Your health care provider should be able to discuss his or her findings with you. It will take longer to discuss the test results if any biopsies were taken.   This information is not intended to replace advice given to you by your health care provider. Make sure you discuss any questions you have with your health care provider.   Document Released: 05/19/2004 Document Revised: 02/06/2014 Document Reviewed: 12/20/2011 Elsevier Interactive Patient Education 2016 [employer].   Esophagogastroduodenoscopy, Care After Refer to this sheet in the next few weeks. These instructions provide you with information about caring for yourself after your procedure. Your health care provider may also give you more specific instructions. Your treatment has been planned according to current medical practices, but problems sometimes occur. Call your health care provider if you have any problems or questions after your procedure. WHAT TO EXPECT AFTER THE PROCEDURE After your procedure, it is typical to feel:  Soreness in your throat.  Pain with swallowing.  Sick to your stomach (nauseous).  Bloated.  Dizzy.  Fatigued. HOME  CARE INSTRUCTIONS  Do not eat or drink anything until the numbing medicine (local anesthetic) has worn off and your gag reflex has returned. You will know that the local anesthetic has worn off when you can swallow comfortably.  Do not drive or operate machinery until directed by your health care provider.  Take medicines only as directed by your health care provider. SEEK MEDICAL CARE IF:   You cannot stop coughing.  You are not urinating at all or less than usual. SEEK IMMEDIATE MEDICAL CARE IF:  You have difficulty swallowing.  You cannot eat or drink.  You have worsening throat or chest pain.  You have dizziness or lightheadedness or you faint.  You have nausea or vomiting.  You have chills.  You have a fever.  You have severe abdominal pain.  You have black, tarry, or bloody stools.   This information is not intended to replace advice given to you by your health care provider. Make sure you discuss any questions you have with your health care provider.   Document Released: 01/03/2012 Document Revised: 02/06/2014 Document Reviewed: 01/03/2012 Elsevier Interactive Patient Education  2016 [place].    Colonoscopy A colonoscopy is an exam to look at the entire large intestine (colon). This exam can help find problems such as tumors, polyps, inflammation, and areas of bleeding. The exam takes about 1 hour.  LET [facility] CARE PROVIDER KNOW ABOUT:   Any allergies you have.  All medicines you are taking, including vitamins, herbs, eye drops, creams, and over-the-counter medicines.  Previous problems you or members of your family have had with the use of anesthetics.  Any blood disorders you have.  Previous surgeries you have had.  Medical conditions you have. RISKS AND COMPLICATIONS  Generally, this is a safe procedure. However, as with any procedure, complications can occur. Possible complications include:  Bleeding.  Tearing or rupture of the colon  wall.  Reaction to medicines given during the exam.  Infection (rare). BEFORE THE PROCEDURE   Ask your health care provider about changing or stopping your regular medicines.  You may be prescribed an oral bowel prep. This involves drinking a large amount of medicated liquid, starting the day before your procedure. The liquid will cause you to have multiple loose stools until your stool is almost clear or light green. This cleans out your colon in preparation for the procedure.  Do not eat or drink anything else once you have started the bowel prep, unless your health care provider tells you it is safe to do so.  Arrange for someone to drive you home after the procedure. PROCEDURE   You will be given medicine to help you relax (sedative).  You will lie on your side with your knees bent.  A long, flexible tube with a light and camera on the end (colonoscope) will be inserted through the rectum and into the colon. The camera sends video back to a computer screen as it moves through the colon. The colonoscope also releases carbon dioxide gas to inflate the colon. This helps your health care provider see the area better.  During the exam, your health care provider may take a small tissue sample (biopsy) to be examined under a microscope if any abnormalities are found.  The exam is finished when the entire colon has been viewed. AFTER THE PROCEDURE   Do not drive for 24 hours after the exam.  You may have a small amount of blood in your stool.  You may pass moderate amounts of gas and have mild abdominal cramping or bloating. This is caused by the gas used to inflate your colon during the exam.  Ask when your test results will be ready and how you will get your results. Make sure you get your test results.   This information is not intended to replace advice given to you by your health care provider. Make sure you discuss any questions you have with your health care provider.    Document Released: 01/14/2000 Document Revised: 11/06/2012 Document Reviewed: 09/23/2012 Elsevier Interactive Patient Education 2016 [employer] Colonoscopy, Care After Refer to this sheet in the next few weeks. These instructions provide you with information on caring for yourself after your procedure. Your health care provider may also give you more specific instructions. Your treatment has been planned according to current medical practices, but problems sometimes occur. Call your health care provider if you have any problems or questions after your procedure. WHAT TO EXPECT AFTER THE PROCEDURE  After your procedure, it is typical to have the following:  A small amount of blood in your stool.  Moderate amounts of  gas and mild abdominal cramping or bloating. HOME CARE INSTRUCTIONS  Do not drive, operate machinery, or sign important documents for 24 hours.  You may shower and resume your regular physical activities, but move at a slower pace for the first 24 hours.  Take frequent rest periods for the first 24 hours.  Walk around or put a warm pack on your abdomen to help reduce abdominal cramping and bloating.  Drink enough fluids to keep your urine clear or pale yellow.  You may resume your normal diet as instructed by your health care provider. Avoid heavy or fried foods that are hard to digest.  Avoid drinking alcohol for 24 hours or as instructed by your health care provider.  Only take over-the-counter or prescription medicines as directed by your health care provider.  If a tissue sample (biopsy) was taken during your procedure:  Do not take aspirin or blood thinners for 7 days, or as instructed by your health care provider.  Do not drink alcohol for 7 days, or as instructed by your health care provider.  Eat soft foods for the first 24 hours. SEEK MEDICAL CARE IF: You have persistent spotting of blood in your stool 2-3 days after the procedure. SEEK IMMEDIATE MEDICAL  CARE IF:  You have more than a small spotting of blood in your stool.  You pass large blood clots in your stool.  Your abdomen is swollen (distended).  You have nausea or vomiting.  You have a fever.  You have increasing abdominal pain that is not relieved with medicine.   This information is not intended to replace advice given to you by your health care provider. Make sure you discuss any questions you have with your health care provider.   Document Released: 08/31/2003 Document Revised: 11/06/2012 Document Reviewed: 09/23/2012 Elsevier Interactive Patient Education 2016 [place] Monitored anesthesia care is an anesthesia service for a medical procedure. Anesthesia is the loss of the ability to feel pain. It is produced by medicines called anesthetics. It may affect a small area of your body (local anesthesia), a large area of your body (regional anesthesia), or your entire body (general anesthesia). The need for monitored anesthesia care depends your procedure, your condition, and the potential need for regional or general anesthesia. It is often provided during procedures where:   General anesthesia may be needed if there are complications. This is because you need special care when you are under general anesthesia.   You will be under local or regional anesthesia. This is so that you are able to have higher levels of anesthesia if needed.   You will receive calming medicines (sedatives). This is especially the case if sedatives are given to put you in a semi-conscious state of relaxation (deep sedation). This is because the amount of sedative needed to produce this state can be hard to predict. Too much of a sedative can produce general anesthesia. Monitored anesthesia care is performed by one or more health care providers who have special training in all types of anesthesia. You will need to meet with these health care providers before your  procedure. During this meeting, they will ask you about your medical history. They will also give you instructions to follow. (For example, you will need to stop eating and drinking before your procedure. You may also need to stop or change medicines you are taking.) During your procedure, your health care providers will stay with you. They will:   Watch your  condition. This includes watching your blood pressure, breathing, and level of pain.   Diagnose and treat problems that occur.   Give medicines if they are needed. These may include calming medicines (sedatives) and anesthetics.   Make sure you are comfortable.  Having monitored anesthesia care does not necessarily mean that you will be under anesthesia. It does mean that your health care providers will be able to manage anesthesia if you need it or if it occurs. It also means that you will be able to have a different type of anesthesia than you are having if you need it. When your procedure is complete, your health care providers will continue to watch your condition. They will make sure any medicines wear off before you are allowed to go home.    This information is not intended to replace advice given to you by your health care provider. Make sure you discuss any questions you have with your health care provider.   Document Released: 10/12/2004 Document Revised: 02/06/2014 Document Reviewed: 02/28/2012 Elsevier Interactive Patient Education 2016 [employer] Inc[employer]   PATIENT INSTRUCTIONS POST-ANESTHESIA  IMMEDIATELY FOLLOWING SURGERY:  Do not drive or operate machinery for the first twenty four hours after surgery.  Do not make any important decisions for twenty four hours after surgery or while taking narcotic pain medications or sedatives.  If you develop intractable nausea and vomiting or a severe headache please notify your doctor immediately.  FOLLOW-UP:  Please make an appointment with your surgeon as instructed. You do not need to  follow up with anesthesia unless specifically instructed to do so.  WOUND CARE INSTRUCTIONS (if applicable):  Keep a dry clean dressing on the anesthesia/puncture wound site if there is drainage.  Once the wound has quit draining you may leave it open to air.  Generally you should leave the bandage intact for twenty four hours unless there is drainage.  If the epidural site drains for more than 36-48 hours please call the anesthesia department.  QUESTIONS?:  Please feel free to call your physician or the hospital operator if you have any questions, and they will be happy to assist you.

## 2015-11-16 ENCOUNTER — Encounter (HOSPITAL_COMMUNITY)
Admission: RE | Admit: 2015-11-16 | Discharge: 2015-11-16 | Disposition: A | Discharge status: Home / Self Care | Payer: Federal, State, Local not specified - PPO | Source: Ambulatory Visit | Attending: Internal Medicine | Admitting: Internal Medicine

## 2015-11-16 ENCOUNTER — Encounter (HOSPITAL_COMMUNITY): Payer: Self-pay

## 2015-11-16 ENCOUNTER — Ambulatory Visit (INDEPENDENT_AMBULATORY_CARE_PROVIDER_SITE_OTHER): Payer: Federal, State, Local not specified - PPO | Admitting: Internal Medicine

## 2015-11-16 DIAGNOSIS — K603 Anal fistula: Secondary | ICD-10-CM | POA: Diagnosis not present

## 2015-11-16 DIAGNOSIS — K529 Noninfective gastroenteritis and colitis, unspecified: Secondary | ICD-10-CM | POA: Diagnosis present

## 2015-11-16 DIAGNOSIS — K635 Polyp of colon: Secondary | ICD-10-CM | POA: Diagnosis not present

## 2015-11-16 DIAGNOSIS — K5010 Crohn's disease of large intestine without complications: Secondary | ICD-10-CM | POA: Diagnosis not present

## 2015-11-16 DIAGNOSIS — K2950 Unspecified chronic gastritis without bleeding: Secondary | ICD-10-CM | POA: Diagnosis not present

## 2015-11-16 HISTORY — DX: Personal history of other diseases of the musculoskeletal system and connective tissue: Z87.39

## 2015-11-19 ENCOUNTER — Encounter (HOSPITAL_COMMUNITY): Payer: Self-pay | Admitting: *Deleted

## 2015-11-19 ENCOUNTER — Encounter (HOSPITAL_COMMUNITY)
Admission: RE | Disposition: A | Discharge status: Home / Self Care | Payer: Self-pay | Source: Ambulatory Visit | Attending: Internal Medicine

## 2015-11-19 ENCOUNTER — Other Ambulatory Visit (INDEPENDENT_AMBULATORY_CARE_PROVIDER_SITE_OTHER): Payer: Self-pay | Admitting: Internal Medicine

## 2015-11-19 ENCOUNTER — Ambulatory Visit (HOSPITAL_COMMUNITY)
Admission: RE | Admit: 2015-11-19 | Discharge: 2015-11-19 | Disposition: A | Discharge status: Home / Self Care | Payer: Federal, State, Local not specified - PPO | Source: Ambulatory Visit | Attending: Internal Medicine | Admitting: Internal Medicine

## 2015-11-19 ENCOUNTER — Ambulatory Visit (HOSPITAL_COMMUNITY): Payer: Federal, State, Local not specified - PPO | Admitting: Anesthesiology

## 2015-11-19 ENCOUNTER — Telehealth (INDEPENDENT_AMBULATORY_CARE_PROVIDER_SITE_OTHER): Payer: Self-pay | Admitting: Internal Medicine

## 2015-11-19 DIAGNOSIS — R197 Diarrhea, unspecified: Secondary | ICD-10-CM | POA: Insufficient documentation

## 2015-11-19 DIAGNOSIS — K603 Anal fistula: Secondary | ICD-10-CM | POA: Diagnosis not present

## 2015-11-19 DIAGNOSIS — K5010 Crohn's disease of large intestine without complications: Secondary | ICD-10-CM | POA: Diagnosis not present

## 2015-11-19 DIAGNOSIS — K219 Gastro-esophageal reflux disease without esophagitis: Secondary | ICD-10-CM | POA: Insufficient documentation

## 2015-11-19 DIAGNOSIS — K2970 Gastritis, unspecified, without bleeding: Secondary | ICD-10-CM | POA: Diagnosis not present

## 2015-11-19 DIAGNOSIS — K50119 Crohn's disease of large intestine with unspecified complications: Secondary | ICD-10-CM

## 2015-11-19 DIAGNOSIS — R140 Abdominal distension (gaseous): Secondary | ICD-10-CM | POA: Diagnosis not present

## 2015-11-19 DIAGNOSIS — K2950 Unspecified chronic gastritis without bleeding: Secondary | ICD-10-CM | POA: Insufficient documentation

## 2015-11-19 DIAGNOSIS — K635 Polyp of colon: Secondary | ICD-10-CM | POA: Insufficient documentation

## 2015-11-19 DIAGNOSIS — K629 Disease of anus and rectum, unspecified: Secondary | ICD-10-CM | POA: Diagnosis not present

## 2015-11-19 HISTORY — PX: ESOPHAGOGASTRODUODENOSCOPY (EGD) WITH PROPOFOL: SHX5813

## 2015-11-19 HISTORY — PX: COLONOSCOPY WITH PROPOFOL: SHX5780

## 2015-11-19 LAB — C DIFFICILE QUICK SCREEN W PCR REFLEX
C DIFFICILE (CDIFF) INTERP: NOT DETECTED
C Diff antigen: NEGATIVE
C Diff toxin: NEGATIVE

## 2015-11-19 SURGERY — COLONOSCOPY WITH PROPOFOL
Anesthesia: Monitor Anesthesia Care

## 2015-11-19 MED ORDER — MIDAZOLAM HCL 5 MG/5ML IJ SOLN
INTRAMUSCULAR | Status: DC | PRN
  Administered 2015-11-19: 2 mg via INTRAVENOUS

## 2015-11-19 MED ORDER — OXYCODONE HCL 5 MG/5ML PO SOLN
5.0000 mg | Freq: Once | ORAL | Status: DC | PRN
  Filled 2015-11-19: qty 5

## 2015-11-19 MED ORDER — PROPOFOL 500 MG/50ML IV EMUL
INTRAVENOUS | Status: DC | PRN
  Administered 2015-11-19: 10:00:00 via INTRAVENOUS
  Administered 2015-11-19: 125 ug/kg/min via INTRAVENOUS

## 2015-11-19 MED ORDER — MIDAZOLAM HCL 2 MG/2ML IJ SOLN
INTRAMUSCULAR | Status: AC
  Filled 2015-11-19: qty 2

## 2015-11-19 MED ORDER — PROPOFOL 10 MG/ML IV BOLUS
INTRAVENOUS | Status: AC
  Filled 2015-11-19: qty 20

## 2015-11-19 MED ORDER — GLYCOPYRROLATE 0.2 MG/ML IJ SOLN
INTRAMUSCULAR | Status: DC | PRN
  Administered 2015-11-19: 0.2 mg via INTRAVENOUS

## 2015-11-19 MED ORDER — LACTATED RINGERS IV SOLN
INTRAVENOUS | Status: DC
Start: 2015-11-19 — End: 2015-11-19
  Administered 2015-11-19: 09:00:00 via INTRAVENOUS

## 2015-11-19 MED ORDER — OXYCODONE HCL 5 MG PO TABS: 5.0000 mg | ORAL_TABLET | Freq: Once | ORAL | Status: DC | PRN

## 2015-11-19 MED ORDER — GLYCOPYRROLATE 0.2 MG/ML IJ SOLN
INTRAMUSCULAR | Status: AC
  Filled 2015-11-19: qty 1

## 2015-11-19 MED ORDER — CHLORHEXIDINE GLUCONATE CLOTH 2 % EX PADS: 6.0000 | MEDICATED_PAD | Freq: Once | CUTANEOUS | Status: DC

## 2015-11-19 MED ORDER — LACTATED RINGERS IV SOLN
INTRAVENOUS | Status: DC | PRN
  Administered 2015-11-19: 09:00:00 via INTRAVENOUS

## 2015-11-19 MED ORDER — TOBRAMYCIN-DEXAMETHASONE 0.3-0.05 % OP SUSP: 1.0000 [drp] | OPHTHALMIC | 0 refills | Status: AC

## 2015-11-19 MED ORDER — FENTANYL CITRATE (PF) 100 MCG/2ML IJ SOLN
INTRAMUSCULAR | Status: AC
  Filled 2015-11-19: qty 2

## 2015-11-19 MED ORDER — SODIUM CHLORIDE 0.9% FLUSH
INTRAVENOUS | Status: AC
  Filled 2015-11-19: qty 10

## 2015-11-19 MED ORDER — FENTANYL CITRATE (PF) 100 MCG/2ML IJ SOLN: 25.0000 ug | INTRAMUSCULAR | Status: DC | PRN

## 2015-11-19 MED ORDER — BUTAMBEN-TETRACAINE-BENZOCAINE 2-2-14 % EX AERO
2.0000 | INHALATION_SPRAY | Freq: Once | CUTANEOUS | Status: AC
Start: 2015-11-19 — End: 2015-11-19
  Administered 2015-11-19: 2 via TOPICAL

## 2015-11-19 MED ORDER — ONDANSETRON HCL 4 MG/2ML IJ SOLN: 4.0000 mg | Freq: Four times a day (QID) | INTRAMUSCULAR | Status: DC | PRN

## 2015-11-19 MED ORDER — FENTANYL CITRATE (PF) 100 MCG/2ML IJ SOLN
INTRAMUSCULAR | Status: DC | PRN
  Administered 2015-11-19 (×2): 25 ug via INTRAVENOUS

## 2015-11-19 NOTE — Transfer of Care (Signed)
Immediate Anesthesia Transfer of Care Note  Patient: Cory [name]  Procedure(s) Performed: Procedure(s) with comments: COLONOSCOPY WITH PROPOFOL (N/A) - random colon biopsy,  hepatic flexure polyp biopsy, sigmoid and rectal biopsy ESOPHAGOGASTRODUODENOSCOPY (EGD) WITH PROPOFOL (N/A) - biopsy duodenum, gastric biopsy  Patient Location: PACU  Anesthesia Type:MAC  Level of Consciousness: awake and patient cooperative  Airway & Oxygen Therapy: Patient Spontanous Breathing and Patient connected to face mask oxygen  Post-op Assessment: Report given to RN, Post -op Vital signs reviewed and stable and Patient moving all extremities  Post vital signs: Reviewed and stable  Last Vitals:  Vitals:   11/19/15 0900 11/19/15 0915  BP: 124/88 116/86  Pulse:    Resp: 15 19  Temp:      Last Pain:  Vitals:   11/19/15 0831  TempSrc: Oral      Patients Stated Pain Goal: 6 (61/44/31 5400)  Complications: No apparent anesthesia complications

## 2015-11-19 NOTE — Op Note (Signed)
[facility] Patient Name: Trampas Stettner Procedure Date: 11/19/2015 10:01 AM MRN: [id number] Date of Birth: Mar 23, 1960 Attending MD: Hildred Laser , MD CSN: [id number] Age: 55 Admit Type: Outpatient Procedure:                Upper GI endoscopy Indications:              Abdominal bloating, Diarrhea Providers:                Hildred Laser, MD, Janeece Riggers, RN, [employer] [facility],                            Technician Referring MD:             Doree Albee MD, MD Medicines:                Cetacaine spray, Propofol per Anesthesia Complications:            No immediate complications. Estimated Blood Loss:     Estimated blood loss was minimal. Procedure:                Pre-Anesthesia Assessment:                           - Prior to the procedure, a History and Physical                            was performed, and patient medications and                            allergies were reviewed. The patient's tolerance of                            previous anesthesia was also reviewed. The risks                            and benefits of the procedure and the sedation                            options and risks were discussed with the patient.                            All questions were answered, and informed consent                            was obtained. Prior Anticoagulants: The patient has                            taken no previous anticoagulant or antiplatelet                            agents. ASA Grade Assessment: II - A patient with                            mild systemic disease. After reviewing the risks  and benefits, the patient was deemed in                            satisfactory condition to undergo the procedure.                           After obtaining informed consent, the endoscope was                            passed under direct vision. Throughout the                            procedure, the patient's blood pressure, pulse, and                       oxygen saturations were monitored continuously. The                            [id number] ([id number]) scope was introduced through the                            mouth, and advanced to the second part of duodenum.                            The upper GI endoscopy was accomplished without                            difficulty. The patient tolerated the procedure                            well. Scope In: 10:07:46 AM Scope Out: 10:15:47 AM Total Procedure Duration: 0 hours 8 minutes 1 second  Findings:      The examined esophagus was normal.      The Z-line was regular and was found 41 cm from the incisors.      Patchy mild inflammation characterized by congestion (edema), erythema       and granularity was found in the gastric antrum. Biopsies were taken       with a cold forceps for histology.      The exam of the stomach was otherwise normal.      The duodenal bulb and second portion of the duodenum were normal.       Biopsies were taken with a cold forceps for histology. Impression:               - Normal esophagus.                           - Z-line regular, 41 cm from the incisors.                           - Gastritis. Biopsied.                           - Normal duodenal bulb and second portion of the  duodenum. Biopsied. Moderate Sedation:      Per Anesthesia Care Recommendation:           - Patient has a contact number available for                            emergencies. The signs and symptoms of potential                            delayed complications were discussed with the                            patient. Return to normal activities tomorrow.                            Written discharge instructions were provided to the                            patient.                           - Resume previous diet today.                           - Continue present medications.                           - Await pathology results.                            - See the other procedure note for documentation of                            additional recommendations. Procedure Code(s):        --- Professional ---                           [phone], Esophagogastroduodenoscopy, flexible,                            transoral; with biopsy, single or multiple Diagnosis Code(s):        --- Professional ---                           K29.70, Gastritis, unspecified, without bleeding                           R14.0, Abdominal distension (gaseous)                           R19.7, Diarrhea, unspecified CPT copyright 2016 American Medical Association. All rights reserved. The codes documented in this report are preliminary and upon coder review may  be revised to meet current compliance requirements. Hildred Laser, MD Hildred Laser, MD 11/19/2015 10:48:27 AM This report has been signed electronically. Number of Addenda: 0

## 2015-11-19 NOTE — Op Note (Signed)
[facility] Patient Name: Cory [name] Procedure Date: 11/19/2015 10:19 AM MRN: [id number] Date of Birth: 04-24-1960 Attending MD: Hildred Laser , MD CSN: [id number] Age: 55 Admit Type: Outpatient Procedure:                Colonoscopy Indications:              Follow-up of Crohn's disease of the colon, Disease                            activity assessment of Crohn's disease of the colon Providers:                Hildred Laser, MD, Janeece Riggers, RN, [facility],                            Technician Referring MD:             Doree Albee MD, MD Medicines:                Propofol per Anesthesia Complications:            No immediate complications. Estimated Blood Loss:     Estimated blood loss was minimal. Procedure:                Pre-Anesthesia Assessment:                           - Prior to the procedure, a History and Physical                            was performed, and patient medications and                            allergies were reviewed. The patient's tolerance of                            previous anesthesia was also reviewed. The risks                            and benefits of the procedure and the sedation                            options and risks were discussed with the patient.                            All questions were answered, and informed consent                            was obtained. Prior Anticoagulants: The patient has                            taken no previous anticoagulant or antiplatelet                            agents. ASA Grade Assessment: II - A patient with  mild systemic disease. After reviewing the risks                            and benefits, the patient was deemed in                            satisfactory condition to undergo the procedure.                           After obtaining informed consent, the colonoscope                            was passed under direct vision. Throughout the                    procedure, the patient's blood pressure, pulse, and                            oxygen saturations were monitored continuously. The                            [id number] ([id number]) scope was introduced through                            the anus and advanced to the the terminal ileum,                            with identification of the ileocecal valve. The                            colonoscopy was performed without difficulty. The                            patient tolerated the procedure well. The quality                            of the bowel preparation was adequate. The quality                            of the bowel preparation was adequate. The terminal                            ileum, the ileocecal valve and the rectum were                            photographed. Scope In: 10:20:48 AM Scope Out: 10:40:48 AM Scope Withdrawal Time: 0 hours 15 minutes 8 seconds  Total Procedure Duration: 0 hours 20 minutes 0 seconds  Findings:      The terminal ileum appeared normal.      The perianal exam findings include perianal fistula.      A 6 mm polyp was found in the hepatic flexure. The polyp was sessile.       Biopsies were taken with a cold forceps for histology.      Discontinuous areas of nonbleeding ulcerated mucosa with no  stigmata of       recent bleeding were present in the cecum. Biopsies were taken with a       cold forceps for histology.      Discontinuous areas of nonbleeding ulcerated mucosa with no stigmata of       recent bleeding were present in the rectum. Biopsies were taken with a       cold forceps for histology.      Discontinuous areas of nonbleeding ulcerated mucosa with no stigmata of       recent bleeding were present in the sigmoid colon. Biopsies were taken       with a cold forceps for histology. Impression:               - The examined portion of the ileum was normal.                           - Skip disease with extensive ulceration  involving                            cecal mucosa, descending colon, sigmoid colon and                            rectum.                           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                           - No specimens collected.                           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 Moderate Sedation:      Per Anesthesia Care Recommendation:           - Patient has a contact number available for                            emergencies. The signs and symptoms of potential                            delayed complications were discussed with the                            patient. Return to normal activities tomorrow.                            Written discharge instructions were provided to the                            patient.                           - Resume previous diet today.                           -  Continue present medications.                           - Await pathology results.                           - No recommendation at this time regarding repeat                            colonoscopy. 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 All rights reserved. The codes documented in this report are preliminary and upon coder review may  be revised to meet current compliance requirements. Hildred Laser, MD Hildred Laser, MD 11/19/2015 10:59:51 AM This report has been signed electronically. Number of Addenda: 0

## 2015-11-19 NOTE — Discharge Instructions (Addendum)
Crohn Disease Crohn disease is a long-lasting (chronic) disease that affects your gastrointestinal (GI) tract. It often causes irritation and swelling (inflammation) in your small intestine and the beginning of your large intestine. However, it can affect any part of your GI tract. Crohn disease is part of a group of illnesses that are known as inflammatory bowel disease (IBD). Crohn disease may start slowly and get worse over time. Symptoms may come and go. They may also disappear for months or even years at a time (remission). CAUSES The exact cause of Crohn disease is not known. It may be a response that causes your body's defense system (immune system) to mistakenly attack healthy cells and tissues (autoimmune response). Your genes and your environment may also play a role. RISK FACTORS You may be at greater risk for Crohn disease if you:  Have other family members with Crohn disease or another IBD.  Use any tobacco products, including cigarettes, chewing tobacco, or electronic cigarettes.  Are in your 22s.  Have [place] European ancestry. SIGNS AND SYMPTOMS The main signs and symptoms of Crohn disease involve your GI tract. These include:  Diarrhea.  Rectal bleeding.  An urgent need to move your bowels.  The feeling that you are not finished having a bowel movement.  Abdominal pain or cramping.  Constipation. General signs and symptoms of Crohn disease may also include:  Unexplained weight loss.  Fatigue.  Fever.  Nausea.  Loss of appetite.  Joint pain  Changes in vision.  Red bumps on your skin. DIAGNOSIS Your health care provider may suspect Crohn disease based on your symptoms and your medical history. Your health care provider will do a physical exam. You may need to see a health care provider who specializes in diseases of the digestive tract (gastroenterologist). You may also have tests to help your health care providers make a diagnosis. These may  include:  Blood tests.  Stool sample tests.  Imaging tests, such as X-rays and CT scans.  Tests to examine the inside of your intestines using a long, flexible tube that has a light and a camera on the end (endoscopy or colonoscopy).  A procedure to take tissue samples from inside your bowel (biopsy) to be examined under a microscope. TREATMENT  There is no cure for Crohn disease. Treatment will focus on managing your symptoms. Crohn disease affects each person differently. Your treatment may include:  Resting your bowels. Drinking only clear liquids or getting nutrition through an IV for a period of time gives your bowels a chance to heal because they are not passing stools.  Medicines. These may be used alone or in combination (combination therapy). These may include antibiotic medicines. You may be given medicines that help to:  Reduce inflammation.  Control your immune system activity.  Fight infections.  Relieve cramps and prevent diarrhea.  Control your pain.  Surgery. You may need surgery if:  Medicines and other treatments are no longer working.  You develop complications from severe Crohn disease.  A section of your intestine becomes so damaged that it needs to be removed. HOME CARE INSTRUCTIONS  Take medicines only as directed by your health care provider.  If you were prescribed an antibiotic medicine, finish it all even if you start to feel better.  Keep all follow-up visits as directed by your health care provider. This is important.  Talk with your health care provider about changing your diet. This may help your symptoms. Your health care provide may recommend changes, such  as:  Drinking more fluids.  Avoiding milk and other foods that contain lactose.  Eating a low-fat diet.  Avoiding high-fiber foods, such as popcorn and nuts.  Avoiding carbonated beverages, such as soda.  Eating smaller meals more often rather than eating large  meals.  Keeping a food diary to identify foods that make your symptoms better or worse.  Do not use any tobacco products, including cigarettes, chewing tobacco, or electronic cigarettes. If you need help quitting, ask your health care provider.  Limit alcohol intake to no more than 1 drink per day for nonpregnant women and 2 drinks per day for men. One drink equals 12 ounces of beer, 5 ounces of wine, or 1 ounces of hard liquor.  Exercise daily or as directed by your health care provider. SEEK MEDICAL CARE IF:  You have diarrhea, abdominal cramps, and other gastrointestinal problems that are present almost all of the time.  Your symptoms do not improve with treatment.  You continue to lose weight.  You develop a rash or sores on your skin.  You develop eye problems.  You have a fever.   Your symptoms get worse.  You develop new symptoms. SEEK IMMEDIATE MEDICAL CARE IF:  You have bloody diarrhea.  You develop severe abdominal pain.  You cannot pass stools.   This information is not intended to replace advice given to you by your health care provider. Make sure you discuss any questions you have with your health care provider.   Document Released: 10/26/2004 Document Revised: 02/06/2014 Document Reviewed: 09/03/2013 Elsevier Interactive Patient Education 2016 [place]. Esophagogastroduodenoscopy, Care After Refer to this sheet in the next few weeks. These instructions provide you with information about caring for yourself after your procedure. Your health care provider may also give you more specific instructions. Your treatment has been planned according to current medical practices, but problems sometimes occur. Call your health care provider if you have any problems or questions after your procedure. WHAT TO EXPECT AFTER THE PROCEDURE After your procedure, it is typical to feel:  Soreness in your throat.  Pain with swallowing.  Sick to your stomach  (nauseous).  Bloated.  Dizzy.  Fatigued. HOME CARE INSTRUCTIONS  Do not eat or drink anything until the numbing medicine (local anesthetic) has worn off and your gag reflex has returned. You will know that the local anesthetic has worn off when you can swallow comfortably.  Do not drive or operate machinery until directed by your health care provider.  Take medicines only as directed by your health care provider. SEEK MEDICAL CARE IF:   You cannot stop coughing.  You are not urinating at all or less than usual. SEEK IMMEDIATE MEDICAL CARE IF:  You have difficulty swallowing.  You cannot eat or drink.  You have worsening throat or chest pain.  You have dizziness or lightheadedness or you faint.  You have nausea or vomiting.  You have chills.  You have a fever.  You have severe abdominal pain.  You have black, tarry, or bloody stools.   This information is not intended to replace advice given to you by your health care provider. Make sure you discuss any questions you have with your health care provider.   Document Released: 01/03/2012 Document Revised: 02/06/2014 Document Reviewed: 01/03/2012 Elsevier Interactive Patient Education 2016 [employer]. Colonoscopy, Care After Refer to this sheet in the next few weeks. These instructions provide you with information on caring for yourself after your procedure. Your health care provider  may also give you more specific instructions. Your treatment has been planned according to current medical practices, but problems sometimes occur. Call your health care provider if you have any problems or questions after your procedure. WHAT TO EXPECT AFTER THE PROCEDURE  After your procedure, it is typical to have the following:  A small amount of blood in your stool.  Moderate amounts of gas and mild abdominal cramping or bloating. HOME CARE INSTRUCTIONS  Do not drive, operate machinery, or sign important documents for 24  hours.  You may shower and resume your regular physical activities, but move at a slower pace for the first 24 hours.  Take frequent rest periods for the first 24 hours.  Walk around or put a warm pack on your abdomen to help reduce abdominal cramping and bloating.  Drink enough fluids to keep your urine clear or pale yellow.  You may resume your normal diet as instructed by your health care provider. Avoid heavy or fried foods that are hard to digest.  Avoid drinking alcohol for 24 hours or as instructed by your health care provider.  Only take over-the-counter or prescription medicines as directed by your health care provider.  If a tissue sample (biopsy) was taken during your procedure:  Do not take aspirin or blood thinners for 7 days, or as instructed by your health care provider.  Do not drink alcohol for 7 days, or as instructed by your health care provider.  Eat soft foods for the first 24 hours. SEEK MEDICAL CARE IF: You have persistent spotting of blood in your stool 2-3 days after the procedure. SEEK IMMEDIATE MEDICAL CARE IF:  You have more than a small spotting of blood in your stool.  You pass large blood clots in your stool.  Your abdomen is swollen (distended).  You have nausea or vomiting.  You have a fever.  You have increasing abdominal pain that is not relieved with medicine.   This information is not intended to replace advice given to you by your health care provider. Make sure you discuss any questions you have with your health care provider.   Document Released: 08/31/2003 Document Revised: 11/06/2012 Document Reviewed: 09/23/2012 Elsevier Interactive Patient Education 2016 [place] usual medications and diet. No driving for 24 hours. Physician will call with results of biopsy and stool tests.

## 2015-11-19 NOTE — H&P (Signed)
Cory [name] is an 55 y.o. male.   Chief Complaint: Patients for EGD and colonoscopy. HPI: Patient is 55 year old Caucasian male a complicated GI history of Crohn's disease who has been on various therapies never has had sustained remission. He and his wife for not convinced of this diagnosis. He previously has been evaluated at [facility] as well as [facility]. He has chronic diarrhea with 3-5 stools per day. He is occasional hematochezia. He also has had perianal fistula with intermittent drainage. He also complains of postprandial bloating but denies nausea and/or vomiting.  Past Medical History:  Diagnosis Date  . Crohn's disease ([place])   . History of degenerative disc disease     Past Surgical History:  Procedure Laterality Date  . COLONOSCOPY  2013  . FLEXIBLE SIGMOIDOSCOPY  01/2012  . HERNIA REPAIR     bilateral with mesh  . Right thumb     Compound fracture  . VASECTOMY      Family History  Problem Relation Age of Onset  . Lymphoma Mother   . Melanoma Father   . Lung cancer Brother   . Healthy Brother    Social History:  reports that he has never smoked. He has never used smokeless tobacco. He reports that he does not drink alcohol or use drugs.  Allergies:  Allergies  Allergen Reactions  . Imuran [Azathioprine] Swelling    Join pain , rash  . Remicade [Infliximab] Anaphylaxis    Joint pain  . Ppd [Tuberculin Purified Protein Derivative] Swelling    Joint pain. Patient rec'd both this and Imuran, not sure which caused this.    Medications Prior to Admission  Medication Sig Dispense Refill  . cholecalciferol (VITAMIN D) 1000 units tablet Take 1,000 Units by mouth daily.    Marland Kitchen loratadine (CLARITIN) 10 MG tablet Take 10 mg by mouth daily.    Marland Kitchen nystatin cream (MYCOSTATIN) Apply 1 application topically daily.     Jonna Coup Leaf Extract 150 MG CAPS Take 1 capsule by mouth daily.    . Omega-3 Fatty Acids (FISH OIL) 1200 MG CAPS Take 2 capsules by mouth 2 (two) times  daily.    . Prasterone, DHEA, (DHEA 50 PO) Take 1 capsule by mouth daily.    . Probiotic Product (PROBIOTIC DAILY PO) Take 1 capsule by mouth daily.      No results found for this or any previous visit (from the past 48 hour(s)). No results found.  ROS  Blood pressure 116/86, pulse 78, temperature 97.8 F (36.6 C), temperature source Oral, resp. rate 19, height 6' 2"  (1.88 m), weight 218 lb (98.9 kg), SpO2 99 %. Physical Exam  Constitutional: He appears well-developed and well-nourished.  HENT:  Mouth/Throat: Oropharynx is clear and moist.  Eyes: Conjunctivae are normal. No scleral icterus.  Neck: No thyromegaly present.  Cardiovascular: Normal rate, regular rhythm and normal heart sounds.   No murmur heard. Respiratory: Effort normal and breath sounds normal.  GI:  Abdomen is symmetrical and soft with mild tenderness at LLQ. No organomegaly or masses.  Musculoskeletal: He exhibits no edema.  Lymphadenopathy:    He has no cervical adenopathy.  Neurological: He is alert.  Skin: Skin is warm and dry.     Assessment/Plan Postprandial bloating. Chronic diarrhea. History of IBD. Diagnostic EGD and colonoscopy under monitored anesthesia care.  Hildred Laser, MD 11/19/2015, 9:50 AM

## 2015-11-19 NOTE — Telephone Encounter (Signed)
,,

## 2015-11-19 NOTE — Anesthesia Preprocedure Evaluation (Signed)
Anesthesia Evaluation  Patient identified by MRN, date of birth, ID band Patient awake    Reviewed: Allergy & Precautions, H&P , NPO status , Patient's Chart, lab work & pertinent test results  Airway Mallampati: II   Neck ROM: full    Dental   Pulmonary neg pulmonary ROS,    breath sounds clear to auscultation       Cardiovascular negative cardio ROS   Rhythm:regular Rate:Normal     Neuro/Psych    GI/Hepatic GERD  ,Crohn's dz   Endo/Other    Renal/GU      Musculoskeletal   Abdominal   Peds  Hematology   Anesthesia Other Findings   Reproductive/Obstetrics                             Anesthesia Physical Anesthesia Plan  ASA: II  Anesthesia Plan: MAC   Post-op Pain Management:    Induction: Intravenous  Airway Management Planned: Nasal Cannula  Additional Equipment:   Intra-op Plan:   Post-operative Plan:   Informed Consent: I have reviewed the patients History and Physical, chart, labs and discussed the procedure including the risks, benefits and alternatives for the proposed anesthesia with the patient or authorized representative who has indicated his/her understanding and acceptance.     Plan Discussed with: CRNA, Anesthesiologist and Surgeon  Anesthesia Plan Comments:         Anesthesia Quick Evaluation

## 2015-11-19 NOTE — Anesthesia Postprocedure Evaluation (Signed)
Anesthesia Post Note  Patient: Cory [name]  Procedure(s) Performed: Procedure(s) (LRB): COLONOSCOPY WITH PROPOFOL (N/A) ESOPHAGOGASTRODUODENOSCOPY (EGD) WITH PROPOFOL (N/A)  Patient location during evaluation: PACU Anesthesia Type: MAC Level of consciousness: awake, oriented and patient cooperative Pain management: pain level controlled Vital Signs Assessment: post-procedure vital signs reviewed and stable Respiratory status: spontaneous breathing, nonlabored ventilation and respiratory function stable Cardiovascular status: blood pressure returned to baseline and stable Postop Assessment: no signs of nausea or vomiting Anesthetic complications: no    Last Vitals:  Vitals:   11/19/15 0900 11/19/15 0915  BP: 124/88 116/86  Pulse:    Resp: 15 19  Temp:      Last Pain:  Vitals:   11/19/15 0831  TempSrc: Oral                 [name] J

## 2015-11-19 NOTE — Progress Notes (Signed)
Telephone call received from patient.  States "my left eye was bothering me after the procedure.  The nurse and my wife couldn't see anything. My wife flushed it out. I went home and feel asleep. I am now awake and feel like something is in it", "I think I may have corneal scratch."  Dr. Laural Golden notified.

## 2015-11-19 NOTE — Telephone Encounter (Signed)
Patient called stating his left eye was red. He believes he has corneal abrasion. His wife [name] checked his iron did not find foreign body. Prescription for TobraDex sent to his pharmacy. He may need office visit with ophthalmologist less symptoms rapidly improved. I symptoms may be due to IBD.

## 2015-11-20 LAB — GASTROINTESTINAL PANEL BY PCR, STOOL (REPLACES STOOL CULTURE)
ADENOVIRUS F40/41: NOT DETECTED
ASTROVIRUS: NOT DETECTED
CAMPYLOBACTER SPECIES: NOT DETECTED
CYCLOSPORA CAYETANENSIS: NOT DETECTED
Cryptosporidium: NOT DETECTED
ENTEROAGGREGATIVE E COLI (EAEC): NOT DETECTED
ENTEROPATHOGENIC E COLI (EPEC): NOT DETECTED
ENTEROTOXIGENIC E COLI (ETEC): NOT DETECTED
Entamoeba histolytica: NOT DETECTED
GIARDIA LAMBLIA: NOT DETECTED
Norovirus GI/GII: NOT DETECTED
PLESIMONAS SHIGELLOIDES: NOT DETECTED
Rotavirus A: NOT DETECTED
Salmonella species: NOT DETECTED
Sapovirus (I, II, IV, and V): NOT DETECTED
Shiga like toxin producing E coli (STEC): NOT DETECTED
Shigella/Enteroinvasive E coli (EIEC): NOT DETECTED
VIBRIO SPECIES: NOT DETECTED
Vibrio cholerae: NOT DETECTED
YERSINIA ENTEROCOLITICA: NOT DETECTED

## 2015-11-24 ENCOUNTER — Encounter (INDEPENDENT_AMBULATORY_CARE_PROVIDER_SITE_OTHER): Payer: Self-pay | Admitting: Internal Medicine

## 2015-11-24 ENCOUNTER — Encounter (HOSPITAL_COMMUNITY): Payer: Self-pay | Admitting: Internal Medicine

## 2015-11-24 LAB — OVA + PARASITE EXAM

## 2015-11-24 LAB — O&P RESULT

## 2015-11-24 NOTE — Progress Notes (Signed)
Patient was given an appointment for 02/29/16 at 9:30am.  A letter was mailed to the patient.

## 2015-11-29 ENCOUNTER — Telehealth (INDEPENDENT_AMBULATORY_CARE_PROVIDER_SITE_OTHER): Payer: Self-pay | Admitting: *Deleted

## 2015-11-29 ENCOUNTER — Encounter (INDEPENDENT_AMBULATORY_CARE_PROVIDER_SITE_OTHER): Payer: Self-pay | Admitting: Internal Medicine

## 2015-11-29 NOTE — Telephone Encounter (Signed)
I started the Budesonide 66m on Saturday. I have an follow up appointment Jan 30th. Just wondering how long I take 957m I have 400 of the 5m76mills. She also suggested doing the Budesonide enema. What is your thoughts. I have had an increase in bowel movements and rectal pain for one week. When the fistula stops draining is seems to cause the problem.  Thank you!

## 2015-11-30 ENCOUNTER — Encounter (INDEPENDENT_AMBULATORY_CARE_PROVIDER_SITE_OTHER): Payer: Self-pay | Admitting: *Deleted

## 2015-11-30 NOTE — Telephone Encounter (Signed)
Per Dr.Rehman - Take the Budesonide 9 mg by mouth for 2 weeks. Then , you will take the Budesonide 6 mg by mouth daily for 1 month. After which you will call the office with a progress report. Also , he would like for you to take Flagyl 250 mg by mouth three times a day for 2 weeks. Please let us know if we need to call that RX in for you.

## 2015-12-02 ENCOUNTER — Other Ambulatory Visit (INDEPENDENT_AMBULATORY_CARE_PROVIDER_SITE_OTHER): Payer: Self-pay | Admitting: *Deleted

## 2015-12-02 MED ORDER — METRONIDAZOLE 250 MG PO TABS: 250.0000 mg | ORAL_TABLET | Freq: Three times a day (TID) | ORAL | 0 refills | Status: DC

## 2016-01-09 ENCOUNTER — Encounter (INDEPENDENT_AMBULATORY_CARE_PROVIDER_SITE_OTHER): Payer: Self-pay | Admitting: Internal Medicine

## 2016-01-22 ENCOUNTER — Telehealth (INDEPENDENT_AMBULATORY_CARE_PROVIDER_SITE_OTHER): Payer: Self-pay | Admitting: Internal Medicine

## 2016-01-22 MED ORDER — CIPROFLOXACIN HCL 500 MG PO TABS: 500.0000 mg | ORAL_TABLET | Freq: Two times a day (BID) | ORAL | 0 refills | Status: DC

## 2016-01-22 MED ORDER — METRONIDAZOLE 250 MG PO TABS: 250.0000 mg | ORAL_TABLET | Freq: Three times a day (TID) | ORAL | 0 refills | Status: DC

## 2016-01-22 NOTE — Telephone Encounter (Signed)
Patient called complaining of increased drainage from fistulae. Advised to taper and discontinue entocort. Begin cipro and flagyl.Script sent to pharmacy. Has OV next month.

## 2016-02-29 ENCOUNTER — Encounter (INDEPENDENT_AMBULATORY_CARE_PROVIDER_SITE_OTHER): Payer: Self-pay | Admitting: *Deleted

## 2016-02-29 ENCOUNTER — Ambulatory Visit (INDEPENDENT_AMBULATORY_CARE_PROVIDER_SITE_OTHER): Payer: Managed Care, Other (non HMO) | Admitting: Internal Medicine

## 2016-02-29 ENCOUNTER — Encounter (INDEPENDENT_AMBULATORY_CARE_PROVIDER_SITE_OTHER): Payer: Self-pay | Admitting: Internal Medicine

## 2016-02-29 VITALS — BP 108/70 | HR 72 | Temp 98.0°F | Resp 18 | Ht 74.0 in | Wt 209.6 lb

## 2016-02-29 DIAGNOSIS — K50113 Crohn's disease of large intestine with fistula: Secondary | ICD-10-CM

## 2016-02-29 LAB — CBC
HCT: 37.8 % — ABNORMAL LOW (ref 38.5–50.0)
Hemoglobin: 12.1 g/dL — ABNORMAL LOW (ref 13.2–17.1)
MCH: 25.8 pg — ABNORMAL LOW (ref 27.0–33.0)
MCHC: 32 g/dL (ref 32.0–36.0)
MCV: 80.6 fL (ref 80.0–100.0)
MPV: 8.3 fL (ref 7.5–12.5)
PLATELETS: 424 10*3/uL — AB (ref 140–400)
RBC: 4.69 MIL/uL (ref 4.20–5.80)
RDW: 15.6 % — AB (ref 11.0–15.0)
WBC: 7.9 10*3/uL (ref 3.8–10.8)

## 2016-02-29 MED ORDER — METRONIDAZOLE 500 MG PO TABS: 500.0000 mg | ORAL_TABLET | Freq: Three times a day (TID) | ORAL | 0 refills | Status: DC

## 2016-02-29 MED ORDER — CIPROFLOXACIN HCL 500 MG PO TABS: 500.0000 mg | ORAL_TABLET | Freq: Two times a day (BID) | ORAL | 0 refills | Status: DC

## 2016-02-29 NOTE — Patient Instructions (Signed)
Physician will call with results of blood tests when completed. Use antibiotics as directed.

## 2016-02-29 NOTE — Progress Notes (Signed)
Presenting complaint;  Follow-up for fistulizing Crohn's disease.  Subjective:  Cory [name]'s 56 year old Caucasian male who has 13 years history of Crohn's disease and is here for scheduled visit. He is accompanied by Cory [name] for backup. He was last seen in June 2017. He underwent colonoscopy on 11/19/2015 veiling normal-appearing terminal ileum skip disease with extensive ulceration involving cecum and ascending and sigmoid colon as well as rectum. He also had 3 perianal fistulae. Biopsies were consistent with Crohn's disease. Since then he's been treated with budesonide but really could not lean difference. Last but he was treated with Cipro and Flagyl when he had increased drainage from perianal fistulae. He is been off antibiotics for about 3 weeks. He is having 4-5 stools per day. Cory stool consistency is loose to unformed. He denies rectal bleeding. He remains with intermittent drainage from Cory fistulae. He denies abdominal pain or fever. He has lost 8 pounds in the last 6 months. He states he has lost weight because he cut back on carbohydrates. Cory [name] has been interested to see Dr. Cheryll Cockayne of [facility] and appointment has been made.    Current Medications: Outpatient Encounter Prescriptions as of 02/29/2016  Medication Sig  . cholecalciferol (VITAMIN D) 1000 units tablet Take 5,000 Units by mouth daily.   Marland Kitchen loratadine (CLARITIN) 10 MG tablet Take 10 mg by mouth daily.  . Omega-3 Fatty Acids (FISH OIL) 1200 MG CAPS Take 2 capsules by mouth 2 (two) times daily.  . Prasterone, DHEA, (DHEA 50 PO) Take 1 capsule by mouth daily.  . Probiotic Product (PROBIOTIC DAILY PO) Take 1 capsule by mouth daily.  . [DISCONTINUED] ciprofloxacin (CIPRO) 500 MG tablet Take 1 tablet (500 mg total) by mouth 2 (two) times daily. (Patient not taking: Reported on 02/29/2016)  . [DISCONTINUED] metroNIDAZOLE (FLAGYL) 250 MG tablet Take 1 tablet (250 mg total) by mouth 3 (three) times daily. (Patient not taking:  Reported on 02/29/2016)  . [DISCONTINUED] metroNIDAZOLE (FLAGYL) 250 MG tablet Take 1 tablet (250 mg total) by mouth 3 (three) times daily. For two weeks  . [DISCONTINUED] nystatin cream (MYCOSTATIN) Apply 1 application topically daily.   . [DISCONTINUED] Olive Leaf Extract 150 MG CAPS Take 1 capsule by mouth daily.   Facility-Administered Encounter Medications as of 02/29/2016  Medication  . gadobenate dimeglumine (MULTIHANCE) injection 20 mL     Objective: Blood pressure 108/70, pulse 72, temperature 98 F (36.7 C), temperature source Oral, resp. rate 18, height 6' 2"  (1.88 m), weight 209 lb 9.6 oz (95.1 kg). Patient is alert and in no acute distress. Conjunctiva is pink. Sclera is nonicteric Oropharyngeal mucosa is normal. No neck masses or thyromegaly noted. Cardiac exam with regular rhythm normal S1 and S2. No murmur or gallop noted. Lungs are clear to auscultation. Abdomen symmetrical soft and nontender without organomegaly or masses. No LE edema or clubbing noted.  Labs/studies Results:  Sedimentation rate on 10/13/2015 was 119.    Assessment:  #1. Colonic and perianal Crohn's disease with fistulae. He has been treated for past that he disease in the past with symptomatic relief. Stool studies by me have been negative for parasites. Recent colonoscopic findings with biopsy revealed no doubt as to the diagnosis. Unfortunately prior therapies have not worked. He has been on mesalamine, 6-MP, Remicade and Humira. Treatment options include vedolizumab, tofacitinib or anti-integrin antibody. Need to find out what is covered and what is not covered before final decision made. He also needs this line testing for hepatitis B and C.  Plan:  Patient will go to the lab for CBC, sedimentation rate, CRP hepatitis C virus antibody and hepatitis B surface antigen. Patient's [name] will bring booklet to find out which medications are covered before final decision made. Consultation with Dr.  Cheryll Cockayne as requested by the patient. Office visit in 3 months.

## 2016-03-01 LAB — HEPATITIS B SURFACE ANTIGEN: HEP B S AG: NEGATIVE

## 2016-03-01 LAB — HEPATITIS C ANTIBODY: HCV Ab: NEGATIVE

## 2016-03-01 LAB — SEDIMENTATION RATE: SED RATE: 7 mm/h (ref 0–20)

## 2016-03-01 LAB — C-REACTIVE PROTEIN: CRP: 12.6 mg/L — AB (ref <8.0)

## 2016-03-06 ENCOUNTER — Other Ambulatory Visit (INDEPENDENT_AMBULATORY_CARE_PROVIDER_SITE_OTHER): Payer: Self-pay | Admitting: Internal Medicine

## 2016-03-07 ENCOUNTER — Other Ambulatory Visit (INDEPENDENT_AMBULATORY_CARE_PROVIDER_SITE_OTHER): Payer: Self-pay | Admitting: *Deleted

## 2016-03-07 DIAGNOSIS — K50113 Crohn's disease of large intestine with fistula: Secondary | ICD-10-CM

## 2016-04-13 ENCOUNTER — Other Ambulatory Visit (INDEPENDENT_AMBULATORY_CARE_PROVIDER_SITE_OTHER): Payer: Self-pay | Admitting: *Deleted

## 2016-04-13 DIAGNOSIS — K50113 Crohn's disease of large intestine with fistula: Secondary | ICD-10-CM

## 2016-05-11 ENCOUNTER — Encounter (INDEPENDENT_AMBULATORY_CARE_PROVIDER_SITE_OTHER): Payer: Self-pay

## 2016-05-26 ENCOUNTER — Encounter (INDEPENDENT_AMBULATORY_CARE_PROVIDER_SITE_OTHER): Payer: Self-pay | Admitting: *Deleted

## 2016-05-26 ENCOUNTER — Other Ambulatory Visit (INDEPENDENT_AMBULATORY_CARE_PROVIDER_SITE_OTHER): Payer: Self-pay | Admitting: *Deleted

## 2016-05-26 DIAGNOSIS — K50113 Crohn's disease of large intestine with fistula: Secondary | ICD-10-CM

## 2016-06-27 ENCOUNTER — Ambulatory Visit (INDEPENDENT_AMBULATORY_CARE_PROVIDER_SITE_OTHER): Payer: Managed Care, Other (non HMO) | Admitting: Internal Medicine

## 2016-07-20 ENCOUNTER — Other Ambulatory Visit (INDEPENDENT_AMBULATORY_CARE_PROVIDER_SITE_OTHER): Payer: Self-pay | Admitting: Internal Medicine

## 2016-07-20 ENCOUNTER — Telehealth (INDEPENDENT_AMBULATORY_CARE_PROVIDER_SITE_OTHER): Payer: Self-pay | Admitting: Internal Medicine

## 2016-07-20 DIAGNOSIS — K50113 Crohn's disease of large intestine with fistula: Secondary | ICD-10-CM

## 2016-07-20 NOTE — Telephone Encounter (Signed)
Talked with patient's wife Cory [name]. She was seen by Dr. Cheryll Cockayne and had CRP and a sedimentation rate and there higher than they have been before. Will do CBC sedimentation rate and CRP again. Will send another request for Xeljanz 10 mg by mouth twice a day

## 2016-07-21 ENCOUNTER — Other Ambulatory Visit (INDEPENDENT_AMBULATORY_CARE_PROVIDER_SITE_OTHER): Payer: Self-pay | Admitting: *Deleted

## 2016-07-21 DIAGNOSIS — K50118 Crohn's disease of large intestine with other complication: Secondary | ICD-10-CM

## 2016-07-21 DIAGNOSIS — K50113 Crohn's disease of large intestine with fistula: Secondary | ICD-10-CM

## 2016-07-21 NOTE — Telephone Encounter (Signed)
Labs have been released , patient will come next week to have them drawn. PA to be worked on for the [employer].

## 2016-07-25 ENCOUNTER — Other Ambulatory Visit (INDEPENDENT_AMBULATORY_CARE_PROVIDER_SITE_OTHER): Payer: Self-pay | Admitting: *Deleted

## 2016-07-25 DIAGNOSIS — Z9289 Personal history of other medical treatment: Secondary | ICD-10-CM

## 2016-07-25 DIAGNOSIS — K5010 Crohn's disease of large intestine without complications: Secondary | ICD-10-CM

## 2016-07-26 ENCOUNTER — Ambulatory Visit (HOSPITAL_COMMUNITY)
Admission: RE | Admit: 2016-07-26 | Discharge: 2016-07-26 | Disposition: A | Discharge status: Home / Self Care | Payer: Managed Care, Other (non HMO) | Source: Ambulatory Visit | Attending: Internal Medicine | Admitting: Internal Medicine

## 2016-07-26 DIAGNOSIS — Z9289 Personal history of other medical treatment: Secondary | ICD-10-CM | POA: Diagnosis present

## 2016-07-26 LAB — CBC
HEMATOCRIT: 38.6 % (ref 38.5–50.0)
Hemoglobin: 12.2 g/dL — ABNORMAL LOW (ref 13.2–17.1)
MCH: 24.1 pg — ABNORMAL LOW (ref 27.0–33.0)
MCHC: 31.6 g/dL — ABNORMAL LOW (ref 32.0–36.0)
MCV: 76.1 fL — AB (ref 80.0–100.0)
MPV: 8.1 fL (ref 7.5–12.5)
PLATELETS: 553 10*3/uL — AB (ref 140–400)
RBC: 5.07 MIL/uL (ref 4.20–5.80)
RDW: 17.4 % — ABNORMAL HIGH (ref 11.0–15.0)
WBC: 10.5 10*3/uL (ref 3.8–10.8)

## 2016-07-27 LAB — HEPATIC FUNCTION PANEL
ALK PHOS: 62 U/L (ref 40–115)
ALT: 9 U/L (ref 9–46)
AST: 14 U/L (ref 10–35)
Albumin: 3.5 g/dL — ABNORMAL LOW (ref 3.6–5.1)
BILIRUBIN DIRECT: 0.1 mg/dL (ref <=0.2)
BILIRUBIN INDIRECT: 0.5 mg/dL (ref 0.2–1.2)
BILIRUBIN TOTAL: 0.6 mg/dL (ref 0.2–1.2)
Total Protein: 7.3 g/dL (ref 6.1–8.1)

## 2016-07-27 LAB — C-REACTIVE PROTEIN: CRP: 15.3 mg/L — ABNORMAL HIGH (ref <8.0)

## 2016-07-27 LAB — SEDIMENTATION RATE: Sed Rate: 44 mm/hr — ABNORMAL HIGH (ref 0–20)

## 2016-07-31 ENCOUNTER — Telehealth (INDEPENDENT_AMBULATORY_CARE_PROVIDER_SITE_OTHER): Payer: Self-pay | Admitting: *Deleted

## 2016-07-31 NOTE — Telephone Encounter (Signed)
A PA was completed for the patient for the medication Xeljanz 10 mg - take 1 by mouth twice a month. #60 with 11 refills. Authorization #[id number] Approved for 1 year-07/31/2017. The Rx was called to the patient's pharmacy and the patient was made aware.

## 2016-08-01 ENCOUNTER — Telehealth (INDEPENDENT_AMBULATORY_CARE_PROVIDER_SITE_OTHER): Payer: Self-pay | Admitting: *Deleted

## 2016-08-01 NOTE — Telephone Encounter (Signed)
Patient was called and given results of previous labs and CXR. He says that his pharmacy has not got the medication in as of yet. He was approved on 07/31/2016 for Xeljanz. He will let us know if there are problems of him getting the medication.

## 2016-08-04 ENCOUNTER — Encounter (INDEPENDENT_AMBULATORY_CARE_PROVIDER_SITE_OTHER): Payer: Self-pay | Admitting: Internal Medicine

## 2016-08-07 ENCOUNTER — Telehealth (INDEPENDENT_AMBULATORY_CARE_PROVIDER_SITE_OTHER): Payer: Self-pay | Admitting: *Deleted

## 2016-08-07 NOTE — Telephone Encounter (Signed)
Hi Dr. Laural Golden,  The pharmacy ordered Morrie Sheldon for me and it still hasn't come in. They did a special re-order and said it should be here Monday. I have a couple of questions;  1. When I start the new medicine do I need to stop the Budesinide then, or do I still need to taper off?  2. I feel much better since I went back to 9 mg of Budesinide, is it safe for me to stay on 38m until I start the XMorrie Sheldon    Thank you for all of your help and for your patience with me as didn't want to start the biologics.    Patient was made aware that this would be given to D[place] 08/08/2016.

## 2016-08-10 NOTE — Telephone Encounter (Signed)
Per patient Dr.Rehman has talked with him about how to taper the medication. Patient started the [place] on 08/08/2016. He will need OV in 4 -6 weeks after this date per NUR.

## 2016-08-14 ENCOUNTER — Encounter (INDEPENDENT_AMBULATORY_CARE_PROVIDER_SITE_OTHER): Payer: Self-pay | Admitting: Internal Medicine

## 2016-08-23 ENCOUNTER — Encounter (INDEPENDENT_AMBULATORY_CARE_PROVIDER_SITE_OTHER): Payer: Self-pay | Admitting: Internal Medicine

## 2016-08-23 NOTE — Progress Notes (Signed)
A staff message has been sent to Reba to look at dates to see when I can bring the patient in.

## 2016-08-23 NOTE — Progress Notes (Signed)
Patient was given an appointment for 09/12/16 at 1:45pm with Dr. Laural Golden.  A letter was mailed to the patient.

## 2016-09-12 ENCOUNTER — Ambulatory Visit (INDEPENDENT_AMBULATORY_CARE_PROVIDER_SITE_OTHER): Payer: Managed Care, Other (non HMO) | Admitting: Internal Medicine

## 2016-09-12 ENCOUNTER — Encounter (INDEPENDENT_AMBULATORY_CARE_PROVIDER_SITE_OTHER): Payer: Self-pay | Admitting: Internal Medicine

## 2016-09-12 VITALS — BP 108/78 | HR 72 | Temp 98.5°F | Resp 18 | Ht 74.0 in | Wt 217.6 lb

## 2016-09-12 DIAGNOSIS — K50113 Crohn's disease of large intestine with fistula: Secondary | ICD-10-CM

## 2016-09-12 NOTE — Progress Notes (Signed)
Presenting complaint;  Follow-up for fistulizing Crohn's disease.  Subjective:  Tone is 56 year old Caucasian male who was difficult to treat Crohn's disease with multiple perianal fistula. He has been on multiple Biologics as well as immunomodulators and oral mesalamine in addition to steroids. These therapies have either not worked or resulted in side effects. He also has been treated with Cipro and metronidazole and few occasions. He underwent EUA by Dr. Cheryll Cockayne of [facility] on 05/26/2016 with opening of posterior anal space and placement of seton. This area still has not healed. His wife Cory [name] who is an Therapist, sports is doing daily packing of this wound. Patient was started on Tofacitinb/Xeljanz about 4 weeks ago. He remains on budesonide presently on 3 mg a day. He feels he is doing better. He is having 1-3 stools per day. On some days he only has one bowel movement. He feels perianal drainage has decreased. He has some pain before the wound drains. He denies abdominal pain fever chills nausea or vomiting. He has good appetite. He has gained 8 pounds since his last visit of January 2018. He is not having any side effects with new biologic other than intermittent headache. He is working full-time.   Current Medications: Outpatient Encounter Prescriptions as of 09/12/2016  Medication Sig  . cholecalciferol (VITAMIN D) 1000 units tablet Take 5,000 Units by mouth daily.   . Omega-3 Fatty Acids (FISH OIL) 1200 MG CAPS Take 2 capsules by mouth 2 (two) times daily.  . Prasterone, DHEA, (DHEA 50 PO) Take 1 capsule by mouth daily.  . Probiotic Product (PROBIOTIC DAILY PO) Take 1 capsule by mouth daily.  . Tofacitinib Citrate (XELJANZ) 10 MG TABS Take 10 mg by mouth 2 (two) times daily.  . [DISCONTINUED] ciprofloxacin (CIPRO) 500 MG tablet Take 1 tablet (500 mg total) by mouth 2 (two) times daily. (Patient not taking: Reported on 09/12/2016)  . [DISCONTINUED] loratadine (CLARITIN) 10 MG tablet Take 10 mg by  mouth daily.  . [DISCONTINUED] metroNIDAZOLE (FLAGYL) 500 MG tablet Take 1 tablet (500 mg total) by mouth 3 (three) times daily. (Patient not taking: Reported on 09/12/2016)   Facility-Administered Encounter Medications as of 09/12/2016  Medication  . gadobenate dimeglumine (MULTIHANCE) injection 20 mL     Objective: Blood pressure 108/78, pulse 72, temperature 98.5 F (36.9 C), temperature source Oral, resp. rate 18, height 6' 2"  (1.88 m), weight 217 lb 9.6 oz (98.7 kg). Patient is alert and in no acute distress. Conjunctiva is pink. Sclera is nonicteric Oropharyngeal mucosa is normal. No neck masses or thyromegaly noted. Cardiac exam with regular rhythm normal S1 and S2. No murmur or gallop noted. Lungs are clear to auscultation. Abdomen soft and nontender without organomegaly or masses. Rectal examination is limited to external inspection. Delphina Cahill is in place located posterior to anal opening. There are 3 openings to the left of anal orifice but these are not draining. 2 are very soft and one located anteriorly is somewhat indurated. No tenderness noted. No LE edema or clubbing noted.  Labs/studies Results: Lab data from 07/26/2016   WBC 10.5, H&H 12.2 and he 8.6, MCV 76.1 and platelet count 553K.  CRP 15.3 (normal less than 8). Sedimentation rate 44.   Assessment:  #1. Fistulizing Crohn's disease. He required [place] of seton over 3 months ago and he's still has some peri-anal drainage from fistulae. Symptomatically he is doing better. It remains to be seen if fistulae would heal with current therapy.   Plan:  Continue budesonide at 3 mg  daily for another 2 weeks and then stop. CBC, metabolic 7, CRP and sedimentation rate in 2 weeks. Office visit in 3 months.

## 2016-09-12 NOTE — Patient Instructions (Signed)
Physician will call with results of blood tests when completed.

## 2016-10-05 LAB — CBC
HCT: 39.3 % (ref 38.5–50.0)
HEMOGLOBIN: 12.3 g/dL — AB (ref 13.2–17.1)
MCH: 24.3 pg — ABNORMAL LOW (ref 27.0–33.0)
MCHC: 31.3 g/dL — ABNORMAL LOW (ref 32.0–36.0)
MCV: 77.7 fL — ABNORMAL LOW (ref 80.0–100.0)
MPV: 8.8 fL (ref 7.5–12.5)
PLATELETS: 389 10*3/uL (ref 140–400)
RBC: 5.06 10*6/uL (ref 4.20–5.80)
RDW: 15.7 % — ABNORMAL HIGH (ref 11.0–15.0)
WBC: 6.3 10*3/uL (ref 3.8–10.8)

## 2016-10-05 LAB — HEPATIC FUNCTION PANEL
AG RATIO: 1.2 (calc) (ref 1.0–2.5)
ALBUMIN MSPROF: 4.2 g/dL (ref 3.6–5.1)
ALT: 19 U/L (ref 9–46)
AST: 17 U/L (ref 10–35)
Alkaline phosphatase (APISO): 100 U/L (ref 40–115)
BILIRUBIN DIRECT: 0.1 mg/dL (ref 0.0–0.2)
BILIRUBIN TOTAL: 0.7 mg/dL (ref 0.2–1.2)
Globulin: 3.6 g/dL (calc) (ref 1.9–3.7)
Indirect Bilirubin: 0.6 mg/dL (calc) (ref 0.2–1.2)
Total Protein: 7.8 g/dL (ref 6.1–8.1)

## 2016-10-05 LAB — C-REACTIVE PROTEIN: CRP: 21.7 mg/L — AB (ref <8.0)

## 2016-10-05 LAB — SEDIMENTATION RATE: Sed Rate: 48 mm/h — ABNORMAL HIGH (ref 0–20)

## 2016-10-09 ENCOUNTER — Other Ambulatory Visit (INDEPENDENT_AMBULATORY_CARE_PROVIDER_SITE_OTHER): Payer: Self-pay | Admitting: *Deleted

## 2016-10-09 DIAGNOSIS — K50113 Crohn's disease of large intestine with fistula: Secondary | ICD-10-CM

## 2016-11-21 ENCOUNTER — Ambulatory Visit (INDEPENDENT_AMBULATORY_CARE_PROVIDER_SITE_OTHER): Payer: Managed Care, Other (non HMO) | Admitting: Internal Medicine

## 2016-12-27 ENCOUNTER — Other Ambulatory Visit (INDEPENDENT_AMBULATORY_CARE_PROVIDER_SITE_OTHER): Payer: Self-pay | Admitting: *Deleted

## 2016-12-27 DIAGNOSIS — K5010 Crohn's disease of large intestine without complications: Secondary | ICD-10-CM

## 2017-01-17 ENCOUNTER — Telehealth (INDEPENDENT_AMBULATORY_CARE_PROVIDER_SITE_OTHER): Payer: Self-pay | Admitting: Internal Medicine

## 2017-01-17 NOTE — Telephone Encounter (Signed)
I had an opening for tomorrow, 01/18/17 at 3:45pm with Dr. Laural Golden.  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  I told him that we would call him with and appointment as soon as my supervisor was able to find an opening for him.  He said ok, thank you.

## 2017-01-29 ENCOUNTER — Telehealth (INDEPENDENT_AMBULATORY_CARE_PROVIDER_SITE_OTHER): Payer: Self-pay | Admitting: Internal Medicine

## 2017-01-29 NOTE — Telephone Encounter (Signed)
The patient was given an appointment for 02/01/17 at 4pm with Dr. Laural Golden.  I called the patient to inform him and he stated that his spouse works at the hospital and she had talked to Dr. Laural Golden last week.  He stated that Dr. Laural Golden is okay with what he is doing.  The patient doesn't feel that he needs to come in.  He will call us if he needs [place].

## 2017-02-01 ENCOUNTER — Ambulatory Visit (INDEPENDENT_AMBULATORY_CARE_PROVIDER_SITE_OTHER): Payer: Managed Care, Other (non HMO) | Admitting: Internal Medicine

## 2017-02-02 ENCOUNTER — Telehealth (INDEPENDENT_AMBULATORY_CARE_PROVIDER_SITE_OTHER): Payer: Self-pay | Admitting: *Deleted

## 2017-02-02 NOTE — Telephone Encounter (Signed)
Patient has been on Xelijanz 10 mg.  It was suggested that the patient break this in half and take if twice a day,(64m). This could not be done. A new Rx was called [facility] for Xeljanz 5 mg - take 1 by mouth twice a day. #60 11 refills.   Number called was [phone] RWells Guileswas called and made aware.

## 2017-04-11 ENCOUNTER — Other Ambulatory Visit (INDEPENDENT_AMBULATORY_CARE_PROVIDER_SITE_OTHER): Payer: Self-pay | Admitting: *Deleted

## 2017-04-11 DIAGNOSIS — K5010 Crohn's disease of large intestine without complications: Secondary | ICD-10-CM

## 2017-04-13 LAB — CBC
HEMATOCRIT: 42.7 % (ref 38.5–50.0)
HEMOGLOBIN: 14.3 g/dL (ref 13.2–17.1)
MCH: 28 pg (ref 27.0–33.0)
MCHC: 33.5 g/dL (ref 32.0–36.0)
MCV: 83.6 fL (ref 80.0–100.0)
MPV: 9.8 fL (ref 7.5–12.5)
Platelets: 317 10*3/uL (ref 140–400)
RBC: 5.11 10*6/uL (ref 4.20–5.80)
RDW: 13.9 % (ref 11.0–15.0)
WBC: 4.9 10*3/uL (ref 3.8–10.8)

## 2017-04-13 LAB — C-REACTIVE PROTEIN: CRP: 5.8 mg/L (ref <8.0)

## 2017-04-13 LAB — SEDIMENTATION RATE: SED RATE: 22 mm/h — AB (ref 0–20)

## 2017-04-16 LAB — TEST AUTHORIZATION

## 2017-04-16 LAB — LIPID PANEL W/REFLEX DIRECT LDL
Cholesterol: 249 mg/dL — ABNORMAL HIGH (ref <200)
HDL: 47 mg/dL (ref >40)
LDL Cholesterol (Calc): 180 mg/dL (calc) — ABNORMAL HIGH
NON-HDL CHOLESTEROL (CALC): 202 mg/dL — AB (ref <130)
TRIGLYCERIDES: 101 mg/dL (ref <150)
Total CHOL/HDL Ratio: 5.3 (calc) — ABNORMAL HIGH (ref <5.0)

## 2017-04-16 LAB — COMPREHENSIVE METABOLIC PANEL
AG RATIO: 1.4 (calc) (ref 1.0–2.5)
ALBUMIN MSPROF: 4.6 g/dL (ref 3.6–5.1)
ALT: 28 U/L (ref 9–46)
AST: 26 U/L (ref 10–35)
Alkaline phosphatase (APISO): 90 U/L (ref 40–115)
BILIRUBIN TOTAL: 1.1 mg/dL (ref 0.2–1.2)
BUN: 10 mg/dL (ref 7–25)
CALCIUM: 10.2 mg/dL (ref 8.6–10.3)
CO2: 30 mmol/L (ref 20–32)
Chloride: 103 mmol/L (ref 98–110)
Creat: 1.1 mg/dL (ref 0.70–1.33)
GLUCOSE: 94 mg/dL (ref 65–99)
Globulin: 3.4 g/dL (calc) (ref 1.9–3.7)
POTASSIUM: 5.3 mmol/L (ref 3.5–5.3)
SODIUM: 138 mmol/L (ref 135–146)
TOTAL PROTEIN: 8 g/dL (ref 6.1–8.1)

## 2017-07-04 ENCOUNTER — Other Ambulatory Visit (INDEPENDENT_AMBULATORY_CARE_PROVIDER_SITE_OTHER): Payer: Self-pay | Admitting: *Deleted

## 2017-07-04 DIAGNOSIS — K5010 Crohn's disease of large intestine without complications: Secondary | ICD-10-CM

## 2017-07-10 LAB — HEPATIC FUNCTION PANEL
AG Ratio: 1.3 (calc) (ref 1.0–2.5)
ALBUMIN MSPROF: 4.5 g/dL (ref 3.6–5.1)
ALT: 36 U/L (ref 9–46)
AST: 26 U/L (ref 10–35)
Alkaline phosphatase (APISO): 78 U/L (ref 40–115)
BILIRUBIN DIRECT: 0.1 mg/dL (ref 0.0–0.2)
GLOBULIN: 3.4 g/dL (ref 1.9–3.7)
Indirect Bilirubin: 0.8 mg/dL (calc) (ref 0.2–1.2)
TOTAL PROTEIN: 7.9 g/dL (ref 6.1–8.1)
Total Bilirubin: 0.9 mg/dL (ref 0.2–1.2)

## 2017-07-10 LAB — LIPID PANEL
CHOL/HDL RATIO: 6 (calc) — AB (ref <5.0)
CHOLESTEROL: 239 mg/dL — AB (ref <200)
HDL: 40 mg/dL — AB (ref >40)
LDL Cholesterol (Calc): 180 mg/dL (calc) — ABNORMAL HIGH
NON-HDL CHOLESTEROL (CALC): 199 mg/dL — AB (ref <130)
TRIGLYCERIDES: 82 mg/dL (ref <150)

## 2017-07-10 LAB — CBC
HEMATOCRIT: 43.5 % (ref 38.5–50.0)
HEMOGLOBIN: 14.6 g/dL (ref 13.2–17.1)
MCH: 27.9 pg (ref 27.0–33.0)
MCHC: 33.6 g/dL (ref 32.0–36.0)
MCV: 83 fL (ref 80.0–100.0)
MPV: 10.1 fL (ref 7.5–12.5)
Platelets: 318 10*3/uL (ref 140–400)
RBC: 5.24 10*6/uL (ref 4.20–5.80)
RDW: 13.7 % (ref 11.0–15.0)
WBC: 7.4 10*3/uL (ref 3.8–10.8)

## 2017-07-10 LAB — C-REACTIVE PROTEIN: CRP: 9.4 mg/L — ABNORMAL HIGH (ref <8.0)

## 2017-07-10 LAB — SEDIMENTATION RATE: Sed Rate: 22 mm/h — ABNORMAL HIGH (ref 0–20)

## 2017-08-18 ENCOUNTER — Encounter (INDEPENDENT_AMBULATORY_CARE_PROVIDER_SITE_OTHER): Payer: Self-pay | Admitting: Internal Medicine

## 2017-09-12 ENCOUNTER — Encounter (INDEPENDENT_AMBULATORY_CARE_PROVIDER_SITE_OTHER): Payer: Self-pay

## 2017-10-09 ENCOUNTER — Other Ambulatory Visit (INDEPENDENT_AMBULATORY_CARE_PROVIDER_SITE_OTHER): Payer: Self-pay | Admitting: *Deleted

## 2017-10-09 ENCOUNTER — Encounter (INDEPENDENT_AMBULATORY_CARE_PROVIDER_SITE_OTHER): Payer: Self-pay | Admitting: Internal Medicine

## 2017-10-09 ENCOUNTER — Ambulatory Visit (INDEPENDENT_AMBULATORY_CARE_PROVIDER_SITE_OTHER): Payer: Managed Care, Other (non HMO) | Admitting: Internal Medicine

## 2017-10-09 VITALS — BP 106/74 | HR 64 | Temp 98.1°F | Resp 18 | Ht 74.0 in | Wt 213.7 lb

## 2017-10-09 DIAGNOSIS — K50113 Crohn's disease of large intestine with fistula: Secondary | ICD-10-CM

## 2017-10-09 MED ORDER — NYSTATIN 100000 UNIT/GM EX CREA: 1.0000 "application " | TOPICAL_CREAM | Freq: Two times a day (BID) | CUTANEOUS | 1 refills | Status: DC

## 2017-10-09 MED ORDER — CIPROFLOXACIN HCL 500 MG PO TABS: 500.0000 mg | ORAL_TABLET | Freq: Two times a day (BID) | ORAL | 0 refills | Status: DC

## 2017-10-09 MED ORDER — METRONIDAZOLE 500 MG PO TABS: 500.0000 mg | ORAL_TABLET | Freq: Two times a day (BID) | ORAL | 0 refills | Status: DC

## 2017-10-09 NOTE — Patient Instructions (Signed)
Will send request your insurance company for [place]. Increase Xeljanz to 15 mg/day(10 in the morning and 5 in the evening or vice versa).

## 2017-10-09 NOTE — Progress Notes (Signed)
Presenting complaint;  Follow-up for colonic and fistulas and Crohn's disease.  Database and subjective:  Patient is 57 year old Caucasian male who has several year history of colonic and perianal Crohn's disease with fistulae.  He previously has been tried on infliximab and Humira.  He is presently on Tofacitinib 5 mg twice daily.  He is accompanied by his wife [name] today. His wife states he was doing very well until few weeks ago when he has developed perineal openings draining mucopurulent material.  They both feel that he was doing much better when he was on higher dose of tofacitinib.  This medication has not been improved by his insurance and he is using samples.  He underwent seton placement back in April 2018 by Dr. Cheryll Cockayne of [facility].  He was last seen by him in May this year. Patient is not having any side effects with current therapy. He wonders what his other options are. In spite of this drainage he does not feel bad.  His wife is worried that continue disease increases risk of rectal cancer.  He is having 2-3 bowel movements every morning.  All of his stools are semi-formed.  He denies melena or rectal bleeding.  He has local discomfort particularly if he is sitting on a hard surface.  He denies fever chills or night sweats.  He has good appetite.  He is trying to lose weight by intermittent fasting.  His weight is down by 4 pounds since his last visit in August 2018.  Current Medications: Outpatient Encounter Medications as of 10/09/2017  Medication Sig  . cholecalciferol (VITAMIN D) 1000 units tablet Take 5,000 Units by mouth daily.   . Omega-3 Fatty Acids (FISH OIL) 1200 MG CAPS Take 2 capsules by mouth 2 (two) times daily.  . Probiotic Product (PROBIOTIC DAILY PO) Take 1 capsule by mouth daily.  . rosuvastatin (CRESTOR) 5 MG tablet Take 5 mg by mouth daily.  . Tofacitinib Citrate (XELJANZ) 10 MG TABS Take 10 mg by mouth 2 (two) times daily.  . [DISCONTINUED] Prasterone,  DHEA, (DHEA 50 PO) Take 1 capsule by mouth daily.   Facility-Administered Encounter Medications as of 10/09/2017  Medication  . gadobenate dimeglumine (MULTIHANCE) injection 20 mL     Objective: Blood pressure 106/74, pulse 64, temperature 98.1 F (36.7 C), temperature source Oral, resp. rate 18, height 6' 2"  (1.88 m), weight 213 lb 11.2 oz (96.9 kg). Patient is alert and in no acute distress. Conjunctiva is pink. Sclera is nonicteric Oropharyngeal mucosa is normal. No neck masses or thyromegaly noted. Cardiac exam with regular rhythm normal S1 and S2. No murmur or gallop noted. Lungs are clear to auscultation. Abdomen is symmetrical soft and nontender with organomegaly or masses. Rectal examination was limited to external inspection.  Delphina Cahill is still in place.  He has induration and erythema to perianal skin with 4 openings 3 of which are on the left side and there is mucopurulent material in perianal region. No LE edema or clubbing noted.    Assessment:  #1.  Crohn's disease.  He has both colonic disease as well as perianal disease with fistulae.  He was doing better with 20 mg of tofacitinib daily but not with 10 mg daily.  It is doubtful that he will be able to continue this medication as his insurance has declined.  Since higher dose has increased risk of pulmonary thrombosis he may be better off with creatinine other agents.  In the meantime he will benefit from antibiotic use.  Plan:  Cipro 500 mg p.o. twice daily for 2 weeks. Metronidazole 500 mg p.o. twice daily for 2 weeks.  Increase tofacitinib dose to 15 mg/day. Will send request for Entyvio/Vedoluzimab for induction and maintenance to his insurance. We will plan to see him in the office after he has received induction therapy with vedolizumab.

## 2017-10-25 ENCOUNTER — Encounter (INDEPENDENT_AMBULATORY_CARE_PROVIDER_SITE_OTHER): Payer: Self-pay

## 2017-10-25 ENCOUNTER — Telehealth (INDEPENDENT_AMBULATORY_CARE_PROVIDER_SITE_OTHER): Payer: Self-pay | Admitting: *Deleted

## 2017-10-25 NOTE — Telephone Encounter (Signed)
Cory [name],  I received an Email from [employer] saying I was approved for Xeljanz 29m through January 2020.    I was just touching base to see if I should refill this prescription or to wait on Dr. RLaural Golden At my last visit he said he would start the ball rolling to get me approved for Entyvio.    Just let me know what I need to do.    Thank you for all that you do!  [name]

## 2017-11-01 ENCOUNTER — Encounter (INDEPENDENT_AMBULATORY_CARE_PROVIDER_SITE_OTHER): Payer: Self-pay | Admitting: *Deleted

## 2017-11-01 ENCOUNTER — Telehealth (INDEPENDENT_AMBULATORY_CARE_PROVIDER_SITE_OTHER): Payer: Self-pay | Admitting: *Deleted

## 2017-11-01 NOTE — Telephone Encounter (Signed)
A PA has been completed for the [facility]. We may have a response later today.

## 2017-11-01 NOTE — Telephone Encounter (Signed)
A PA has been completed for Entyvio today ,11/01/2017. I will address this with Dr.Rehman and let the patient know.

## 2017-11-02 ENCOUNTER — Telehealth (INDEPENDENT_AMBULATORY_CARE_PROVIDER_SITE_OTHER): Payer: Self-pay | Admitting: *Deleted

## 2017-11-02 NOTE — Telephone Encounter (Signed)
Talked with the patient's wife , Jacqlyn Larsen. She was made aware that the [employer] denied the [place] and ask that the patient use the Stelara.  They are going to read up on this medication and will let us know on Monday their decision .

## 2017-11-07 ENCOUNTER — Telehealth (INDEPENDENT_AMBULATORY_CARE_PROVIDER_SITE_OTHER): Payer: Self-pay | Admitting: *Deleted

## 2017-11-07 NOTE — Telephone Encounter (Signed)
A PA for Stelara had been completed. He was approved for this medication The confirmation number is [id number].Marland Kitchen The IV Stelara was approved for the next 30 days, the Stelara SQ Injections are approved 11/07/2017 - 11/08/2019 . A order was sent to [facility] for the IV , once we get the date we will call a prescription in for the Stelara Injections. [place] [phone].  Patient is scheduled for 11/09/2017 to recieve  the Stelara IV @ 12:30 pm.  Patient was called and made aware.

## 2017-11-09 ENCOUNTER — Encounter (HOSPITAL_COMMUNITY): Payer: Self-pay

## 2017-11-09 ENCOUNTER — Encounter (HOSPITAL_COMMUNITY)
Admission: RE | Admit: 2017-11-09 | Discharge: 2017-11-09 | Disposition: A | Discharge status: Home / Self Care | Payer: 59 | Source: Ambulatory Visit | Attending: Internal Medicine | Admitting: Internal Medicine

## 2017-11-09 DIAGNOSIS — K5010 Crohn's disease of large intestine without complications: Secondary | ICD-10-CM | POA: Diagnosis not present

## 2017-11-09 MED ORDER — DIPHENHYDRAMINE HCL 25 MG PO CAPS
25.0000 mg | ORAL_CAPSULE | Freq: Once | ORAL | Status: AC
  Administered 2017-11-09: 25 mg via ORAL

## 2017-11-09 MED ORDER — USTEKINUMAB 130 MG/26ML IV SOLN
520.0000 mg | Freq: Once | INTRAVENOUS | Status: AC
  Administered 2017-11-09: 520 mg via INTRAVENOUS
  Filled 2017-11-09: qty 104

## 2017-11-09 MED ORDER — ACETAMINOPHEN 325 MG PO TABS
650.0000 mg | ORAL_TABLET | Freq: Once | ORAL | Status: AC
  Administered 2017-11-09: 650 mg via ORAL

## 2017-11-09 MED ORDER — DIPHENHYDRAMINE HCL 25 MG PO CAPS
ORAL_CAPSULE | ORAL | Status: AC
  Filled 2017-11-09: qty 1

## 2017-11-09 MED ORDER — SODIUM CHLORIDE 0.9 % IV SOLN
Freq: Once | INTRAVENOUS | Status: AC
  Administered 2017-11-09: 13:00:00 via INTRAVENOUS

## 2017-11-09 MED ORDER — ACETAMINOPHEN 325 MG PO TABS
ORAL_TABLET | ORAL | Status: AC
  Filled 2017-11-09: qty 2

## 2017-11-09 MED ORDER — PREDNISONE 20 MG PO TABS
30.0000 mg | ORAL_TABLET | Freq: Once | ORAL | Status: AC
  Administered 2017-11-09: 30 mg via ORAL
  Filled 2017-11-09: qty 1

## 2017-11-09 NOTE — Progress Notes (Signed)
Patient stated that he is itching on the back of left leg.  Upon assessment, small whelp noted.  Watched for another 15 minutes, in which time three others appeared.  Approximately from the size of a dime up to a quarter.  No other s/s of reaction noted.  Breathing wnl.  Vital signs at baseline.  No swelling of face, eyes, lips or throat noted.  Dr Laural Golden notified and order taken for 30 mg of po prednisone given per order.  States okay to let him go home after dose.  Patient left department at 1600.  No acute distress at that time.

## 2017-11-12 ENCOUNTER — Encounter (INDEPENDENT_AMBULATORY_CARE_PROVIDER_SITE_OTHER): Payer: Self-pay | Admitting: *Deleted

## 2017-12-07 ENCOUNTER — Encounter (INDEPENDENT_AMBULATORY_CARE_PROVIDER_SITE_OTHER): Payer: Self-pay | Admitting: *Deleted

## 2017-12-31 ENCOUNTER — Telehealth (INDEPENDENT_AMBULATORY_CARE_PROVIDER_SITE_OTHER): Payer: Self-pay | Admitting: *Deleted

## 2017-12-31 ENCOUNTER — Other Ambulatory Visit (INDEPENDENT_AMBULATORY_CARE_PROVIDER_SITE_OTHER): Payer: Self-pay | Admitting: *Deleted

## 2017-12-31 ENCOUNTER — Encounter (INDEPENDENT_AMBULATORY_CARE_PROVIDER_SITE_OTHER): Payer: Self-pay

## 2017-12-31 DIAGNOSIS — K50113 Crohn's disease of large intestine with fistula: Secondary | ICD-10-CM

## 2017-12-31 NOTE — Telephone Encounter (Signed)
Jacqlyn Larsen , Patient's wife presented to the office. Patient is having loose stools odor is very foul. He is having pain in his back, it is so bad that his hips will lock up on him. All 5 fistula's are open and draining. He is not taking any medications at all . We are awaiting  a peer to peer review for the [facility]. Dr.Rehman has been made aware.  Per Dr.Rehman , get the following labs - CBC/Diff, CRP, Sedrate, and a C-Met. Patient is to take the Cipro anf Flagyl 500 mg - take 1 by mouth twice a day for 2 weeks. This has been called to the patient's pharmacy and Jacqlyn Larsen has been made aware.  Patient will be called with the results of labs.

## 2018-01-01 LAB — CBC WITH DIFFERENTIAL/PLATELET
BASOS ABS: 38 {cells}/uL (ref 0–200)
BASOS PCT: 0.4 %
EOS PCT: 0.8 %
Eosinophils Absolute: 76 cells/uL (ref 15–500)
HEMATOCRIT: 43.4 % (ref 38.5–50.0)
HEMOGLOBIN: 14.3 g/dL (ref 13.2–17.1)
Lymphs Abs: 1302 cells/uL (ref 850–3900)
MCH: 27.2 pg (ref 27.0–33.0)
MCHC: 32.9 g/dL (ref 32.0–36.0)
MCV: 82.7 fL (ref 80.0–100.0)
MONOS PCT: 9 %
MPV: 9.5 fL (ref 7.5–12.5)
NEUTROS ABS: 7230 {cells}/uL (ref 1500–7800)
Neutrophils Relative %: 76.1 %
Platelets: 366 10*3/uL (ref 140–400)
RBC: 5.25 10*6/uL (ref 4.20–5.80)
RDW: 13.5 % (ref 11.0–15.0)
Total Lymphocyte: 13.7 %
WBC mixed population: 855 cells/uL (ref 200–950)
WBC: 9.5 10*3/uL (ref 3.8–10.8)

## 2018-01-01 LAB — SEDIMENTATION RATE: Sed Rate: 39 mm/h — ABNORMAL HIGH (ref 0–20)

## 2018-01-01 LAB — COMPREHENSIVE METABOLIC PANEL
AG Ratio: 1.1 (calc) (ref 1.0–2.5)
ALBUMIN MSPROF: 4.1 g/dL (ref 3.6–5.1)
ALT: 13 U/L (ref 9–46)
AST: 14 U/L (ref 10–35)
Alkaline phosphatase (APISO): 108 U/L (ref 40–115)
BILIRUBIN TOTAL: 0.9 mg/dL (ref 0.2–1.2)
BUN: 9 mg/dL (ref 7–25)
CALCIUM: 9.5 mg/dL (ref 8.6–10.3)
CHLORIDE: 101 mmol/L (ref 98–110)
CO2: 29 mmol/L (ref 20–32)
Creat: 0.96 mg/dL (ref 0.70–1.33)
GLOBULIN: 3.9 g/dL — AB (ref 1.9–3.7)
Glucose, Bld: 82 mg/dL (ref 65–139)
POTASSIUM: 4.2 mmol/L (ref 3.5–5.3)
SODIUM: 137 mmol/L (ref 135–146)
Total Protein: 8 g/dL (ref 6.1–8.1)

## 2018-01-01 LAB — C-REACTIVE PROTEIN: CRP: 24.3 mg/L — ABNORMAL HIGH (ref <8.0)

## 2018-01-17 ENCOUNTER — Encounter (HOSPITAL_COMMUNITY): Payer: Self-pay

## 2018-01-17 ENCOUNTER — Encounter (HOSPITAL_COMMUNITY)
Admission: RE | Admit: 2018-01-17 | Discharge: 2018-01-17 | Disposition: A | Discharge status: Home / Self Care | Payer: 59 | Source: Ambulatory Visit | Attending: Internal Medicine | Admitting: Internal Medicine

## 2018-01-17 DIAGNOSIS — K5010 Crohn's disease of large intestine without complications: Secondary | ICD-10-CM | POA: Insufficient documentation

## 2018-01-17 MED ORDER — SODIUM CHLORIDE 0.9 % IV SOLN
INTRAVENOUS | Status: DC
  Administered 2018-01-17: 11:00:00 via INTRAVENOUS

## 2018-01-17 MED ORDER — DIPHENHYDRAMINE HCL 25 MG PO CAPS
25.0000 mg | ORAL_CAPSULE | Freq: Once | ORAL | Status: AC
  Administered 2018-01-17: 25 mg via ORAL
  Filled 2018-01-17: qty 1

## 2018-01-17 MED ORDER — VEDOLIZUMAB 300 MG IV SOLR
300.0000 mg | Freq: Once | INTRAVENOUS | Status: AC
  Administered 2018-01-17: 300 mg via INTRAVENOUS
  Filled 2018-01-17: qty 5

## 2018-01-17 MED ORDER — ACETAMINOPHEN 325 MG PO TABS
650.0000 mg | ORAL_TABLET | Freq: Once | ORAL | Status: AC
  Administered 2018-01-17: 650 mg via ORAL
  Filled 2018-01-17: qty 2

## 2018-01-17 NOTE — Progress Notes (Signed)
Patient completed dose 1 of Entyvio without s/s of reaction.  Kept for 30 minutes post infusion.  VS WNL pre during and post infusion.  Information given to patient regarding drug and patient assistance program.

## 2018-01-31 ENCOUNTER — Encounter (HOSPITAL_COMMUNITY): Payer: Self-pay

## 2018-01-31 ENCOUNTER — Encounter (HOSPITAL_COMMUNITY)
Admission: RE | Admit: 2018-01-31 | Discharge: 2018-01-31 | Disposition: A | Discharge status: Home / Self Care | Payer: 59 | Source: Ambulatory Visit | Attending: Internal Medicine | Admitting: Internal Medicine

## 2018-01-31 DIAGNOSIS — K5010 Crohn's disease of large intestine without complications: Secondary | ICD-10-CM | POA: Insufficient documentation

## 2018-01-31 MED ORDER — VEDOLIZUMAB 300 MG IV SOLR
300.0000 mg | Freq: Once | INTRAVENOUS | Status: AC
  Administered 2018-01-31: 300 mg via INTRAVENOUS
  Filled 2018-01-31: qty 5

## 2018-01-31 MED ORDER — SODIUM CHLORIDE 0.9 % IV SOLN
Freq: Once | INTRAVENOUS | Status: AC
  Administered 2018-01-31: 250 mL via INTRAVENOUS

## 2018-01-31 MED ORDER — DIPHENHYDRAMINE HCL 25 MG PO CAPS
25.0000 mg | ORAL_CAPSULE | Freq: Once | ORAL | Status: AC
  Administered 2018-01-31: 25 mg via ORAL
  Filled 2018-01-31: qty 1

## 2018-01-31 MED ORDER — ACETAMINOPHEN 325 MG PO TABS
650.0000 mg | ORAL_TABLET | Freq: Once | ORAL | Status: AC
  Administered 2018-01-31: 650 mg via ORAL
  Filled 2018-01-31: qty 2

## 2018-01-31 NOTE — Discharge Instructions (Signed)
Vedolizumab injection solution What is this medicine? VEDOLIZUMAB (Ve doe LIZ you mab) is used to treat ulcerative colitis and Crohn's disease in adult patients. This medicine may be used for other purposes; ask your health care provider or pharmacist if you have questions. COMMON BRAND NAME(S): Entyvio What should I tell my health care provider before I take this medicine? They need to know if you have any of these conditions: -diabetes -hepatitis B or history of hepatitis B infection -HIV or AIDS -immune system problems -infection or history of infections -liver disease -recently received or scheduled to receive a vaccine -scheduled to have surgery -tuberculosis, a positive skin test for tuberculosis or have recently been in close contact with someone who has tuberculosis - an unusual or allergic reaction to vedolizumab, other medicines, foods, dyes, or preservatives -pregnant or trying to get pregnant -breast-feeding How should I use this medicine? This medicine is for infusion into a vein. It is given by a health care professional in a hospital or clinic setting. A special MedGuide will be given to you by the pharmacist with each prescription and refill. Be sure to read this information carefully each time. Talk to your pediatrician regarding the use of this medicine in children. This medicine is not approved for use in children. Overdosage: If you think you have taken too much of this medicine contact a poison control center or emergency room at once. NOTE: This medicine is only for you. Do not share this medicine with others. What if I miss a dose? It is important not to miss your dose. Call your doctor or health care professional if you are unable to keep an appointment. What may interact with this medicine? -steroid medicines like prednisone or cortisone -TNF-alpha inhibitors like natalizumab, adalimumab, and infliximab -vaccines This list may not describe all possible  interactions. Give your health care provider a list of all the medicines, herbs, non-prescription drugs, or dietary supplements you use. Also tell them if you smoke, drink alcohol, or use illegal drugs. Some items may interact with your medicine. What should I watch for while using this medicine? Your condition will be monitored carefully while you are receiving this medicine. Visit your doctor for regular check ups. Tell your doctor or healthcare professional if your symptoms do not start to get better or if they get worse. Stay away from people who are sick. Call your doctor or health care professional for advice if you get a fever, chills or sore throat, or other symptoms of a cold or flu. Do not treat yourself. In some patients, this medicine may cause a serious brain infection that may cause death. If you have any problems seeing, thinking, speaking, walking, or standing, tell your doctor right away. If you cannot reach your doctor, get urgent medical care. What side effects may I notice from receiving this medicine? Side effects that you should report to your doctor or health care professional as soon as possible: -allergic reactions like skin rash, itching or hives, swelling of the face, lips, or tongue -breathing problems -changes in vision -chest pain -dark urine -depression, feelings of sadness -dizziness -general ill feeling or flu-like symptoms -irregular, missed, or painful menstrual periods -light-colored stools -loss of appetite, nausea -muscle weakness -problems with balance, talking, or walking -right upper belly pain -unusually weak or tired -yellowing of the eyes or skin Side effects that usually do not require medical attention (report to your doctor or health care professional if they continue or are bothersome): -aches, pains -headache -  stomach upset -tiredness This list may not describe all possible side effects. Call your doctor for medical advice about side  effects. You may report side effects to FDA at 1-800-FDA-1088. Where should I keep my medicine? This drug is given in a hospital or clinic and will not be stored at home. NOTE: This sheet is a summary. It may not cover all possible information. If you have questions about this medicine, talk to your doctor, pharmacist, or health care provider.  2019 Elsevier/Gold Standard (2015-02-18 08:36:51)

## 2018-02-12 ENCOUNTER — Ambulatory Visit (INDEPENDENT_AMBULATORY_CARE_PROVIDER_SITE_OTHER): Payer: 59 | Admitting: Internal Medicine

## 2018-02-12 ENCOUNTER — Encounter (INDEPENDENT_AMBULATORY_CARE_PROVIDER_SITE_OTHER): Payer: Self-pay | Admitting: Internal Medicine

## 2018-02-12 VITALS — BP 113/77 | HR 73 | Temp 98.2°F | Resp 18 | Ht 74.0 in | Wt 220.5 lb

## 2018-02-12 DIAGNOSIS — K50113 Crohn's disease of large intestine with fistula: Secondary | ICD-10-CM

## 2018-02-12 MED ORDER — CIPROFLOXACIN HCL 500 MG PO TABS: 500.0000 mg | ORAL_TABLET | Freq: Two times a day (BID) | ORAL | 1 refills | Status: DC

## 2018-02-12 MED ORDER — METRONIDAZOLE 250 MG PO TABS: 250.0000 mg | ORAL_TABLET | Freq: Three times a day (TID) | ORAL | 1 refills | Status: DC

## 2018-02-12 NOTE — Patient Instructions (Addendum)
Next blood work to be done at the time of fourth infusion of [employer]

## 2018-02-12 NOTE — Progress Notes (Signed)
Presenting complaint;  Follow-up for Crohn's disease.  Database and subjective:  Patient is 58 year old Caucasian male who was colonic and perianal fistulas and Crohn's disease who is here for scheduled visit.  He was last seen about 4 months ago.  His disease has been refractory to therapy.  To date he is been treated with infliximab, adalimumab tofacitinib and ustekinumab.  These medications either have not worked or he develop allergies.  He developed anaphylaxis with infliximab and hives with ustekinumab.  He also could not tolerate azathioprine.  He had underwent seton placement in April 2018 when his perianal disease has not healed he still has seton in place.  He has an appointment with Dr. Cheryll Cockayne in May 2020. He was begun on vedolizumab and is due for third dose tomorrow. He is not sure if his condition is any better.  He has daily drainage from perianal fistula.  It is up and down.  He uses pad and has to change it few times a day.  He is having 3-4 bowel movements per day.  Stool consistency varies between loose and normal he denies melena or rectal bleeding.  His appetite is good.  He has gained 7 pounds since his last visit. His wife [name] is concerned about him getting cancer because of active disease.  He is working full-time despite his ongoing symptoms.  He is not having any side effects with vedolizumab.  Current Medications: Outpatient Encounter Medications as of 02/12/2018  Medication Sig  . nystatin cream (MYCOSTATIN) Apply 1 application topically 2 (two) times daily.  . Probiotic Product (PROBIOTIC DAILY PO) Take 1 capsule by mouth daily.  . rosuvastatin (CRESTOR) 5 MG tablet Take 5 mg by mouth daily.  . vedolizumab (ENTYVIO) 300 MG injection Inject 300 mg into the vein.  . cholecalciferol (VITAMIN D) 1000 units tablet Take 5,000 Units by mouth daily.   . Omega-3 Fatty Acids (FISH OIL) 1200 MG CAPS Take 2 capsules by mouth 2 (two) times daily.  . [DISCONTINUED]  ciprofloxacin (CIPRO) 500 MG tablet Take 1 tablet (500 mg total) by mouth 2 (two) times daily. (Patient not taking: Reported on 02/12/2018)  . [DISCONTINUED] metroNIDAZOLE (FLAGYL) 500 MG tablet Take 1 tablet (500 mg total) by mouth 2 (two) times daily. (Patient not taking: Reported on 02/12/2018)  . [DISCONTINUED] Tofacitinib Citrate (XELJANZ) 10 MG TABS Take 10 mg by mouth 2 (two) times daily.   Facility-Administered Encounter Medications as of 02/12/2018  Medication  . gadobenate dimeglumine (MULTIHANCE) injection 20 mL     Objective: Blood pressure 113/77, pulse 73, temperature 98.2 F (36.8 C), temperature source Oral, resp. rate 18, height _0  (1.88 m), weight 220 lb 8 oz (100 kg). Patient is alert and in no acute distress. Conjunctiva is pink. Sclera is nonicteric Oropharyngeal mucosa is normal. No neck masses or thyromegaly noted. Cardiac exam with regular rhythm normal S1 and S2. No murmur or gallop noted. Lungs are clear to auscultation. Abdomen is symmetrical soft and nontender with organomegaly or masses. Rectal examination was limited to external inspection.  He has 6 perianal fistulae 2 on the right side and 4 on the left side and 1 of them is draining mucopurulent material.  He also has some erythema and edema to perianal skin.  On palpation there is no induration or fluctuation.  Seton remains in place. No LE edema or clubbing noted.  Labs/studies Results:  CBC Latest Ref Rng & Units 12/31/2017 07/09/2017 04/12/2017  WBC 3.8 - 10.8 Thousand/uL 9.5 7.4  4.9  Hemoglobin 13.2 - 17.1 g/dL 14.3 14.6 14.3  Hematocrit 38.5 - 50.0 % 43.4 43.5 42.7  Platelets 140 - 400 Thousand/uL 366 318 317    CMP Latest Ref Rng & Units 12/31/2017 07/09/2017 04/12/2017  Glucose 65 - 139 mg/dL 82 - 94  BUN 7 - 25 mg/dL 9 - 10  Creatinine 0.70 - 1.33 mg/dL 0.96 - 1.10  Sodium 135 - 146 mmol/L 137 - 138  Potassium 3.5 - 5.3 mmol/L 4.2 - 5.3  Chloride 98 - 110 mmol/L 101 - 103  CO2 20 - 32 mmol/L  29 - 30  Calcium 8.6 - 10.3 mg/dL 9.5 - 10.2  Total Protein 6.1 - 8.1 g/dL 8.0 7.9 8.0  Total Bilirubin 0.2 - 1.2 mg/dL 0.9 0.9 1.1  Alkaline Phos 40 - 115 U/L - - -  AST 10 - 35 U/L _0 ALT 9 - 46 U/L 13 36 28    Hepatic Function Latest Ref Rng & Units 12/31/2017 07/09/2017 04/12/2017  Total Protein 6.1 - 8.1 g/dL 8.0 7.9 8.0  Albumin 3.6 - 5.1 g/dL - - -  AST 10 - 35 U/L _1 ALT 9 - 46 U/L 13 36 28  Alk Phosphatase 40 - 115 U/L - - -  Total Bilirubin 0.2 - 1.2 mg/dL 0.9 0.9 1.1  Bilirubin, Direct 0.0 - 0.2 mg/dL - 0.1 -    Lab Results  Component Value Date   CRP 24.3 (H) 12/31/2017     e Assessment:  #1.  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  He has been on 3 Biologics which either did not work or caused side effects.  He also failed JAk inhibitor(Xeljanz) if he developed hives with monoclonal IL-12 and IL 23 antibody(Stelara) and now he is on alpha for beta 7 integrin antibody and will be receiving third dose tomorrow.  He remains to be seen if it would help.  Another option would be metformin or yet another biologic.  He has mucopurulent drainage from 1 of the perianal fistulae and he would benefit from short course of antibiotics.   Plan:  Cipro 500 mg by mouth twice daily for 2 weeks. Metronidazole to 50 mg by mouth 3 times daily for 2 weeks. He will have CBC with differential, sed rate, CRP and comprehensive chemistry panel at the time of fourth infusion of vedolizumab which would be about 2 months from now. Office visit in June 2020.

## 2018-02-14 ENCOUNTER — Encounter (HOSPITAL_COMMUNITY)
Admission: RE | Admit: 2018-02-14 | Discharge: 2018-02-14 | Disposition: A | Discharge status: Home / Self Care | Payer: 59 | Source: Ambulatory Visit | Attending: Internal Medicine | Admitting: Internal Medicine

## 2018-02-14 DIAGNOSIS — K5010 Crohn's disease of large intestine without complications: Secondary | ICD-10-CM | POA: Diagnosis not present

## 2018-02-14 MED ORDER — VEDOLIZUMAB 300 MG IV SOLR
300.0000 mg | Freq: Once | INTRAVENOUS | Status: AC
  Administered 2018-02-14: 300 mg via INTRAVENOUS
  Filled 2018-02-14: qty 5

## 2018-02-14 MED ORDER — DIPHENHYDRAMINE HCL 25 MG PO CAPS
25.0000 mg | ORAL_CAPSULE | Freq: Once | ORAL | Status: AC
  Administered 2018-02-14: 25 mg via ORAL
  Filled 2018-02-14: qty 1

## 2018-02-14 MED ORDER — ACETAMINOPHEN 325 MG PO TABS
650.0000 mg | ORAL_TABLET | Freq: Once | ORAL | Status: AC
  Administered 2018-02-14: 650 mg via ORAL
  Filled 2018-02-14: qty 2

## 2018-02-14 MED ORDER — SODIUM CHLORIDE 0.9 % IV SOLN
Freq: Once | INTRAVENOUS | Status: AC
  Administered 2018-02-14: 10:00:00 via INTRAVENOUS

## 2018-02-15 ENCOUNTER — Other Ambulatory Visit (INDEPENDENT_AMBULATORY_CARE_PROVIDER_SITE_OTHER): Payer: Self-pay | Admitting: *Deleted

## 2018-02-15 DIAGNOSIS — K50113 Crohn's disease of large intestine with fistula: Secondary | ICD-10-CM

## 2018-03-27 ENCOUNTER — Encounter (INDEPENDENT_AMBULATORY_CARE_PROVIDER_SITE_OTHER): Payer: Self-pay | Admitting: *Deleted

## 2018-03-27 ENCOUNTER — Other Ambulatory Visit (INDEPENDENT_AMBULATORY_CARE_PROVIDER_SITE_OTHER): Payer: Self-pay | Admitting: *Deleted

## 2018-03-27 DIAGNOSIS — K50113 Crohn's disease of large intestine with fistula: Secondary | ICD-10-CM

## 2018-04-11 ENCOUNTER — Encounter (HOSPITAL_COMMUNITY): Payer: 59

## 2018-04-12 ENCOUNTER — Encounter (HOSPITAL_COMMUNITY): Payer: 59

## 2018-06-10 ENCOUNTER — Encounter: Payer: Self-pay | Admitting: Internal Medicine

## 2018-07-09 ENCOUNTER — Encounter (INDEPENDENT_AMBULATORY_CARE_PROVIDER_SITE_OTHER): Payer: Self-pay | Admitting: Internal Medicine

## 2018-07-09 ENCOUNTER — Other Ambulatory Visit: Payer: Self-pay

## 2018-07-09 ENCOUNTER — Ambulatory Visit (INDEPENDENT_AMBULATORY_CARE_PROVIDER_SITE_OTHER): Payer: 59 | Admitting: Internal Medicine

## 2018-07-09 VITALS — BP 110/75 | HR 73 | Temp 98.1°F | Resp 18 | Ht 74.0 in | Wt 219.7 lb

## 2018-07-09 DIAGNOSIS — K603 Anal fistula: Secondary | ICD-10-CM | POA: Diagnosis not present

## 2018-07-09 DIAGNOSIS — K50113 Crohn's disease of large intestine with fistula: Secondary | ICD-10-CM | POA: Diagnosis not present

## 2018-07-09 MED ORDER — METRONIDAZOLE 250 MG PO TABS: 250.0000 mg | ORAL_TABLET | Freq: Three times a day (TID) | ORAL | 0 refills | Status: DC

## 2018-07-09 NOTE — Progress Notes (Signed)
Presenting complaint;  Follow-up for colonic and perianal fistulas and Crohn's disease.  Database and subjective:  Patient is 58 year old Caucasian male who has several year history of colonic and perianal fistulas and Crohn's disease who has failed multiple therapies.  He has been on vedolizumab since December 2019.  He received fifth dose on 06/10/2018.  He was last seen in the office on 02/12/2018.  Since his last visit he has used Cipro and metronidazole once when he had flareup of perianal disease. He returns for scheduled visit accompanied by his wife for backup. He had a seton placed by Dr. Cheryll Cockayne in April 2018.  He states his seton fell about 5 weeks ago few days prior to his visit with Dr. Cheryll Cockayne on 06/13/2018.  On that visit he was felt to have small abscess but it ruptured spontaneously and did not require I&D. Patient states this is the best he has felt in a long time.  Drainage has decreased significantly over the last few weeks.  He also has noted less discomfort.  He and his wife wonder if course of antibiotic may help heal this fistula completely. He denies abdominal pain melena or rectal bleeding.  He has intermittent bloating.  He generally has 1-2 formed stools daily.  He has changed his eating habits completely.  He does not drink sodas or coffee anymore.  He drinks coconut all water daily.  His appetite is good and his weight has been stable.  Current Medications: Outpatient Encounter Medications as of 07/09/2018  Medication Sig  . cholecalciferol (VITAMIN D) 1000 units tablet Take 5,000 Units by mouth daily.   . ciprofloxacin (CIPRO) 500 MG tablet Take 1 tablet (500 mg total) by mouth 2 (two) times daily.  . metroNIDAZOLE (FLAGYL) 250 MG tablet Take 1 tablet (250 mg total) by mouth 3 (three) times daily.  Marland Kitchen nystatin cream (MYCOSTATIN) Apply 1 application topically 2 (two) times daily.  . Probiotic Product (PROBIOTIC DAILY PO) Take 1 capsule by mouth daily.  .  Rosuvastatin Calcium 10 MG CPSP Take 10 mg by mouth daily.   . vedolizumab (ENTYVIO) 300 MG injection Inject 300 mg into the vein.  . Omega-3 Fatty Acids (FISH OIL) 1200 MG CAPS Take 2 capsules by mouth 2 (two) times daily.   Facility-Administered Encounter Medications as of 07/09/2018  Medication  . gadobenate dimeglumine (MULTIHANCE) injection 20 mL     Objective: Blood pressure 110/75, pulse 73, temperature 98.1 F (36.7 C), temperature source Oral, resp. rate 18, height 6' 2"  (1.88 m), weight 219 lb 11.2 oz (99.7 kg). Patient is alert and in no acute distress. He is wearing facial mask. Conjunctiva is pink. Sclera is nonicteric Oropharyngeal mucosa is normal. No neck masses or thyromegaly noted. Cardiac exam with regular rhythm normal S1 and S2. No murmur or gallop noted. Lungs are clear to auscultation. Abdomen is symmetrical soft and nontender with organomegaly or masses. Rectal examination limited to external exam.  He has focal erythematous skin on the right side without induration.  He has 4 perianal fistulae on the left side with minimal drainage but no induration or tenderness. No LE edema or clubbing noted.  Labs/studies Results: Lab data from 06/10/2018 WBC 8.21, H&H 14.1 and 45.9 and platelet count 319K Sed rate 48(normal up to 20) CRP 1.15(normal up to 0.30)   Assessment:  #1.  Colonic and perianal fistulazing  Crohn's disease.  Patient currently is on vedolizumab which was begun 6 months ago.  Examination of perianal area reveals  significant improvement but fistulae have not completely healed.  He may benefit from metronidazole hoping to eradicate infection and healed these fistulae.  He is tolerating vedolizumab without any side effects.  Plan:  Metronidazole 250 mg by mouth 3 times a day.  Prescription given for 1 month.  Response to therapy would be assessed in 2 weeks. He will have CRP and sed rate with his next blood work at the time of vedolizumab  infusion. Office visit in 6 months.

## 2018-07-09 NOTE — Patient Instructions (Signed)
Next blood work to be done at the time of infusion in 4 weeks.

## 2018-08-05 ENCOUNTER — Telehealth (INDEPENDENT_AMBULATORY_CARE_PROVIDER_SITE_OTHER): Payer: Self-pay | Admitting: *Deleted

## 2018-08-05 ENCOUNTER — Encounter (INDEPENDENT_AMBULATORY_CARE_PROVIDER_SITE_OTHER): Payer: Self-pay

## 2018-08-05 NOTE — Telephone Encounter (Signed)
Hey Dr. Laural Golden   This is Cory, [name] took 10 days of Flagyl and did not notice a difference. Then on 6/21 he had watery diarrhea all night. He felt like it was a bug. After that his fistulas got very inflamed and pain. He started Cipro on 6/26 and added Flagyl 7/2. He had his Entyvio today. They told him to check with you because they have some patients they infusion every 4 weeks for a while to heal fistulas, His is trying to heal from the outside. Just let us know your thoughts.   Thanks so much!

## 2018-08-07 ENCOUNTER — Telehealth (INDEPENDENT_AMBULATORY_CARE_PROVIDER_SITE_OTHER): Payer: Self-pay | Admitting: Internal Medicine

## 2018-08-07 NOTE — Telephone Encounter (Signed)
Lab results reviewed with patient's wife for backup. CRP is elevated. He had 1 day bout of self-limiting diarrhea and since then he has had more drainage and perianal pain. He did try metronidazole without Cipro and it did not help with healing of fistulae. Now she is having more drainage.  He will go back on Cipro and Flagyl and continue for at least 2 weeks. Patient seems to do much better for the first 1 month while he is on Entyvio. Will request that treatment duration be changed to every 4 weeks instead of 8. He will have CBC comprehensive chemistry panel and CRP in 3 months.

## 2018-08-08 ENCOUNTER — Other Ambulatory Visit (INDEPENDENT_AMBULATORY_CARE_PROVIDER_SITE_OTHER): Payer: Self-pay | Admitting: Internal Medicine

## 2018-08-08 NOTE — Telephone Encounter (Signed)
Already addressed. Please note from 08/07/18

## 2018-08-09 ENCOUNTER — Other Ambulatory Visit (INDEPENDENT_AMBULATORY_CARE_PROVIDER_SITE_OTHER): Payer: Self-pay | Admitting: Internal Medicine

## 2018-08-09 MED ORDER — CIPROFLOXACIN HCL 500 MG PO TABS: 500.0000 mg | ORAL_TABLET | Freq: Two times a day (BID) | ORAL | 1 refills | Status: DC

## 2018-08-09 MED ORDER — METRONIDAZOLE 250 MG PO TABS: 250.0000 mg | ORAL_TABLET | Freq: Three times a day (TID) | ORAL | 0 refills | Status: DC

## 2018-08-13 ENCOUNTER — Other Ambulatory Visit (INDEPENDENT_AMBULATORY_CARE_PROVIDER_SITE_OTHER): Payer: Self-pay | Admitting: *Deleted

## 2018-08-13 DIAGNOSIS — K50113 Crohn's disease of large intestine with fistula: Secondary | ICD-10-CM

## 2018-08-14 ENCOUNTER — Encounter (INDEPENDENT_AMBULATORY_CARE_PROVIDER_SITE_OTHER): Payer: Self-pay

## 2018-08-14 NOTE — Telephone Encounter (Signed)
A PA will need to be done ,  Once completed a new order will be sent to [facility]. Lab work in noted for first part of October, a letter will be sent as a reminder to the patient.

## 2018-08-19 ENCOUNTER — Other Ambulatory Visit: Payer: Self-pay

## 2018-08-19 ENCOUNTER — Emergency Department (HOSPITAL_COMMUNITY): Payer: 59

## 2018-08-19 ENCOUNTER — Emergency Department (HOSPITAL_COMMUNITY)
Admission: EM | Admit: 2018-08-19 | Discharge: 2018-08-19 | Disposition: A | Discharge status: Home / Self Care | Payer: 59 | Attending: Emergency Medicine | Admitting: Emergency Medicine

## 2018-08-19 ENCOUNTER — Encounter (HOSPITAL_COMMUNITY): Payer: Self-pay | Admitting: *Deleted

## 2018-08-19 DIAGNOSIS — K50119 Crohn's disease of large intestine with unspecified complications: Secondary | ICD-10-CM | POA: Insufficient documentation

## 2018-08-19 DIAGNOSIS — R197 Diarrhea, unspecified: Secondary | ICD-10-CM | POA: Insufficient documentation

## 2018-08-19 DIAGNOSIS — R109 Unspecified abdominal pain: Secondary | ICD-10-CM | POA: Diagnosis not present

## 2018-08-19 DIAGNOSIS — R5383 Other fatigue: Secondary | ICD-10-CM | POA: Diagnosis not present

## 2018-08-19 DIAGNOSIS — Z79899 Other long term (current) drug therapy: Secondary | ICD-10-CM | POA: Diagnosis not present

## 2018-08-19 LAB — COMPREHENSIVE METABOLIC PANEL
ALT: 15 U/L (ref 0–44)
AST: 16 U/L (ref 15–41)
Albumin: 3.8 g/dL (ref 3.5–5.0)
Alkaline Phosphatase: 83 U/L (ref 38–126)
Anion gap: 6 (ref 5–15)
BUN: 11 mg/dL (ref 6–20)
CO2: 26 mmol/L (ref 22–32)
Calcium: 8.9 mg/dL (ref 8.9–10.3)
Chloride: 104 mmol/L (ref 98–111)
Creatinine, Ser: 0.99 mg/dL (ref 0.61–1.24)
GFR calc Af Amer: >60 mL/min (ref >60)
GFR calc non Af Amer: >60 mL/min (ref >60)
Glucose, Bld: 102 mg/dL — ABNORMAL HIGH (ref 70–99)
Potassium: 3.7 mmol/L (ref 3.5–5.1)
Sodium: 136 mmol/L (ref 135–145)
Total Bilirubin: 1.5 mg/dL — ABNORMAL HIGH (ref 0.3–1.2)
Total Protein: 8.4 g/dL — ABNORMAL HIGH (ref 6.5–8.1)

## 2018-08-19 LAB — CBC WITH DIFFERENTIAL/PLATELET
Abs Immature Granulocytes: 0.04 10*3/uL (ref 0.00–0.07)
Basophils Absolute: 0 10*3/uL (ref 0.0–0.1)
Basophils Relative: 1 %
Eosinophils Absolute: 0.2 10*3/uL (ref 0.0–0.5)
Eosinophils Relative: 3 %
HCT: 44.3 % (ref 39.0–52.0)
Hemoglobin: 14.1 g/dL (ref 13.0–17.0)
Immature Granulocytes: 1 %
Lymphocytes Relative: 17 %
Lymphs Abs: 1.4 10*3/uL (ref 0.7–4.0)
MCH: 26.8 pg (ref 26.0–34.0)
MCHC: 31.8 g/dL (ref 30.0–36.0)
MCV: 84.2 fL (ref 80.0–100.0)
Monocytes Absolute: 0.9 10*3/uL (ref 0.1–1.0)
Monocytes Relative: 11 %
Neutro Abs: 5.7 10*3/uL (ref 1.7–7.7)
Neutrophils Relative %: 67 %
Platelets: 366 10*3/uL (ref 150–400)
RBC: 5.26 MIL/uL (ref 4.22–5.81)
RDW: 14.2 % (ref 11.5–15.5)
WBC: 8.3 10*3/uL (ref 4.0–10.5)
nRBC: 0 % (ref 0.0–0.2)

## 2018-08-19 LAB — C DIFFICILE QUICK SCREEN W PCR REFLEX
C Diff antigen: NEGATIVE
C Diff interpretation: NOT DETECTED
C Diff toxin: NEGATIVE

## 2018-08-19 LAB — C-REACTIVE PROTEIN: CRP: 4.5 mg/dL — ABNORMAL HIGH (ref <1.0)

## 2018-08-19 LAB — SEDIMENTATION RATE: Sed Rate: 38 mm/hr — ABNORMAL HIGH (ref 0–16)

## 2018-08-19 MED ORDER — AMOXICILLIN-POT CLAVULANATE 875-125 MG PO TABS: 1.0000 | ORAL_TABLET | Freq: Two times a day (BID) | ORAL | 0 refills | Status: DC

## 2018-08-19 MED ORDER — SODIUM CHLORIDE 0.9 % IV BOLUS
1000.0000 mL | Freq: Once | INTRAVENOUS | Status: AC
  Administered 2018-08-19: 1000 mL via INTRAVENOUS

## 2018-08-19 MED ORDER — IOHEXOL 300 MG/ML  SOLN
100.0000 mL | Freq: Once | INTRAMUSCULAR | Status: AC | PRN
  Administered 2018-08-19: 100 mL via INTRAVENOUS

## 2018-08-19 NOTE — ED Triage Notes (Signed)
Pt c/o diarrhea every 2-3 hours for the last couple of weeks. Pt was put on Cipro and Flagyl by Dr. Laural Golden but then started having hives and stopped taking it. Pt was sent to ED by Dr. Laural Golden. Hx of Crohns.

## 2018-08-19 NOTE — ED Provider Notes (Signed)
[facility] EMERGENCY DEPARTMENT Provider Note   CSN: [id number] Arrival date & time: 08/19/18  0700    History   Chief Complaint Chief Complaint  Patient presents with   Diarrhea    HPI Cory [name] is a 58 y.o. male.     HPI Patient presents with diarrhea.  Sent in by Dr. Laural Golden.  Had been on Cipro and Flagyl but stopped when he developed a rash.  Unknown if it is the Entyvio Cipro or Flagyl causing the hives.  States he is been more fatigued.  Frequent diarrhea.  Somewhat watery.  History of Crohn's disease.  Around 2 weeks ago had diarrhea that had resolved some but over the last few days has increased again.  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  Some mild abdominal pain.  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 Past Medical History:  Diagnosis Date   Crohn's disease ([place])    History of degenerative disc disease     Patient Active Problem List   Diagnosis Date Noted   Gastroesophageal reflux disease without esophagitis 10/06/2015   Diarrhea 10/06/2015   Crohn's colitis ([place]) 02/03/2015   Intestinal giardiasis 02/03/2015   Tapeworm infection 02/03/2015    Past Surgical History:  Procedure Laterality Date   COLONOSCOPY  2013   COLONOSCOPY WITH PROPOFOL N/A 11/19/2015   Procedure: COLONOSCOPY WITH PROPOFOL;  Surgeon: Rogene Houston, MD;  Location: [facility];  Service: Endoscopy;  Laterality: N/A;  random colon biopsy,  hepatic flexure polyp biopsy, sigmoid and rectal biopsy   ESOPHAGOGASTRODUODENOSCOPY (EGD) WITH PROPOFOL N/A 11/19/2015   Procedure: ESOPHAGOGASTRODUODENOSCOPY (EGD) WITH PROPOFOL;  Surgeon: Rogene Houston, MD;  Location: [facility];  Service: Endoscopy;  Laterality: N/A;  biopsy duodenum, gastric biopsy   FLEXIBLE SIGMOIDOSCOPY  01/2012   HERNIA REPAIR     bilateral with mesh   Right thumb     Compound fracture   VASECTOMY          Home Medications    Prior to  Admission medications   Medication Sig Start Date End Date Taking? Authorizing Provider  cholecalciferol (VITAMIN D) 1000 units tablet Take 5,000 Units by mouth daily.    Yes [provider]  Omega-3 Fatty Acids (FISH OIL) 1200 MG CAPS Take 2 capsules by mouth 2 (two) times daily.   Yes [provider]  Probiotic Product (PROBIOTIC DAILY PO) Take 1 capsule by mouth daily.   Yes [provider]  vedolizumab (ENTYVIO) 300 MG injection Inject 300 mg into the vein.   Yes [provider]  amoxicillin-clavulanate (AUGMENTIN) 875-125 MG tablet Take 1 tablet by mouth every 12 (twelve) hours. 08/19/18   Davonna Belling, MD  ciprofloxacin (CIPRO) 500 MG tablet Take 1 tablet (500 mg total) by mouth 2 (two) times daily. Patient not taking: Reported on 08/19/2018 08/09/18   Rogene Houston, MD  metroNIDAZOLE (FLAGYL) 250 MG tablet Take 1 tablet (250 mg total) by mouth 3 (three) times daily. Patient not taking: Reported on 08/19/2018 02/12/18   Rogene Houston, MD  metroNIDAZOLE (FLAGYL) 250 MG tablet Take 1 tablet (250 mg total) by mouth 3 (three) times daily. Patient not taking: Reported on 08/19/2018 08/09/18   Rogene Houston, MD  nystatin cream (MYCOSTATIN) Apply 1 application topically 2 (two) times daily. Patient not taking: Reported on 08/19/2018 10/09/17   Rogene Houston, MD    Family History Family History  Problem  Relation Age of Onset   Lymphoma Mother    Melanoma Father    Lung cancer Brother    Healthy Brother     Social History Social History   Tobacco Use   Smoking status: Never Smoker   Smokeless tobacco: Never Used  Substance Use Topics   Alcohol use: No    Alcohol/week: 0.0 standard drinks   Drug use: No     Allergies   Imuran [azathioprine], Remicade [infliximab], Stelara [ustekinumab], and Ppd [tuberculin purified protein derivative]   Review of Systems Review of Systems  Constitutional: Positive for appetite change and  fatigue. Negative for fever.  HENT: Negative for congestion.   Respiratory: Negative for shortness of breath.   Cardiovascular: Negative for chest pain.  Gastrointestinal: Positive for abdominal pain and diarrhea.  Genitourinary: Negative for flank pain.  Musculoskeletal: Negative for back pain.  Skin: Negative for rash.  Neurological: Negative for weakness and light-headedness.     Physical Exam Updated Vital Signs BP 108/78    Pulse 78    Temp 97.9 F (36.6 C) (Oral)    Resp 16    Ht 6' 2"  (1.88 m)    Wt 96.6 kg    SpO2 95%    BMI 27.35 kg/m   Physical Exam Vitals signs and nursing note reviewed.  HENT:     Head: Normocephalic.     Mouth/Throat:     Mouth: Mucous membranes are moist.  Neck:     Musculoskeletal: Neck supple.  Cardiovascular:     Rate and Rhythm: Regular rhythm.  Pulmonary:     Breath sounds: No wheezing, rhonchi or rales.  Abdominal:     Comments: Mild left-sided tenderness without rebound or guarding.  Genitourinary:    Comments: Anal fistulas with some induration on the right buttock area. Musculoskeletal:     Right lower leg: No edema.     Left lower leg: No edema.  Skin:    General: Skin is warm.     Capillary Refill: Capillary refill takes less than 2 seconds.  Neurological:     Mental Status: He is alert. Mental status is at baseline.      ED Treatments / Results  Labs (all labs ordered are listed, but only abnormal results are displayed) Labs Reviewed  COMPREHENSIVE METABOLIC PANEL - Abnormal; Notable for the following components:      Result Value   Glucose, Bld 102 (*)    Total Protein 8.4 (*)    Total Bilirubin 1.5 (*)    All other components within normal limits  SEDIMENTATION RATE - Abnormal; Notable for the following components:   Sed Rate 38 (*)    All other components within normal limits  C-REACTIVE PROTEIN - Abnormal; Notable for the following components:   CRP 4.5 (*)    All other components within normal limits    GASTROINTESTINAL PANEL BY PCR, STOOL (REPLACES STOOL CULTURE)  C DIFFICILE QUICK SCREEN W PCR REFLEX  CBC WITH DIFFERENTIAL/PLATELET    EKG None  Radiology Ct Abdomen Pelvis W Contrast  Result Date: 08/19/2018 CLINICAL DATA:  Generalized abdominal pain, history Crohn's disease, repair, fistula EXAM: CT ABDOMEN AND PELVIS WITH CONTRAST TECHNIQUE: Multidetector CT imaging of the abdomen and pelvis was performed using the standard protocol following bolus administration of intravenous contrast. CONTRAST:  165m OMNIPAQUE IOHEXOL 300 MG/ML  SOLN COMPARISON:  None Correlation: MR abdomen and pelvis 07/26/2015 FINDINGS: Lower chest: Minimal subsegmental atelectasis versus scarring LEFT lower lobe. Hepatobiliary: Cholelithiasis. No gallbladder wall thickening.  Tiny cyst superiorly RIGHT lobe 6 mm diameter. Liver otherwise unremarkable. Pancreas: Normal appearance Spleen: Normal appearance.  Small splenule. Adrenals/Urinary Tract: Adrenal glands normal appearance. Small BILATERAL renal cysts. No additional renal mass, hydronephrosis or urinary tract calcification. Bladder and ureters unremarkable Stomach/Bowel: Normal appendix. Stomach under distended, otherwise unremarkable. Small bowel loops normal appearance. Fat deposition within the wall of the descending and sigmoid colon, nonspecific but can be seen with prior inflammation. Mild hyperemia of the sigmoid mesocolon with suspicion of mild mucosal hyperenhancement of the sigmoid colon raising question of subtle colitis. Remaining bowel loops unremarkable. Vascular/Lymphatic: Atherosclerotic calcifications of aorta and iliac arteries without aneurysm scattered normal size retroperitoneal nodes. Reproductive: Unremarkable prostate gland and seminal vesicles Other: No free air or free fluid. Questionable small LEFT inguinal hernia. Perianal inflammatory changes are seen with tracks extending posteriorly in the medial aspects of both buttocks. Focal collection  identified at the medial RIGHT buttock, bilobed, with 2 contiguous collections measuring 21 x 17 mm and 22 x 14 mm, question perianal abscess. Similar finding on LEFT is smaller 16 x 10 mm. The RIGHT-side collection extends cranially to adjacent to the coccygeal tip. Musculoskeletal: No acute osseous findings. IMPRESSION: Mild hyperemia of the sigmoid mesocolon with probable mild mucosal hyperenhancement of the sigmoid colon as well, findings suggestive of subtle sigmoid colitis, such as from Crohn's disease. Fat deposition within the wall of the sigmoid colon which may be related to prior inflammation. Perianal fistula extending bilaterally into the medial buttocks where small fluid collections are seen question small abscesses as above. Cholelithiasis. Electronically Signed   By: Lavonia Dana M.D.   On: 08/19/2018 10:15    Procedures Procedures (including critical care time)  Medications Ordered in ED Medications  sodium chloride 0.9 % bolus 1,000 mL (0 mLs Intravenous Stopped 08/19/18 1001)  iohexol (OMNIPAQUE) 300 MG/ML solution 100 mL (100 mLs Intravenous Contrast Given 08/19/18 0947)     Initial Impression / Assessment and Plan / ED Course  I have reviewed the triage vital signs and the nursing notes.  Pertinent labs & imaging results that were available during my care of the patient were reviewed by me and considered in my medical decision making (see chart for details).       Patient presents with abdominal pain diarrhea.  Known Crohn's disease.  Lab work reassuring.  However does have likely active Crohn's in his sigmoid.  Does have also potential left buttock abscess.  Discussed with Dr. Laural Golden, who is very familiar with the patient.  Will start Augmentin.  Follow-up with his surgeon, Dr. Morton Stall if needed for the abscess.  Will continue and increase his sitz bath's.  Discharge home.  Final Clinical Impressions(s) / ED Diagnoses   Final diagnoses:  Diarrhea, unspecified type  Crohn's  disease of large intestine with complication [facility])    ED Discharge Orders         Ordered    amoxicillin-clavulanate (AUGMENTIN) 875-125 MG tablet  Every 12 hours     08/19/18 1152           Davonna Belling, MD 08/19/18 1156

## 2018-08-20 LAB — GASTROINTESTINAL PANEL BY PCR, STOOL (REPLACES STOOL CULTURE)

## 2018-09-04 ENCOUNTER — Ambulatory Visit (INDEPENDENT_AMBULATORY_CARE_PROVIDER_SITE_OTHER): Payer: 59 | Admitting: Internal Medicine

## 2018-09-04 ENCOUNTER — Encounter (INDEPENDENT_AMBULATORY_CARE_PROVIDER_SITE_OTHER): Payer: Self-pay | Admitting: Internal Medicine

## 2018-09-04 ENCOUNTER — Other Ambulatory Visit: Payer: Self-pay

## 2018-09-04 VITALS — BP 118/77 | HR 98 | Temp 99.2°F | Resp 18 | Ht 74.0 in | Wt 213.1 lb

## 2018-09-04 DIAGNOSIS — K603 Anal fistula, unspecified: Secondary | ICD-10-CM | POA: Insufficient documentation

## 2018-09-04 DIAGNOSIS — K50114 Crohn's disease of large intestine with abscess: Secondary | ICD-10-CM | POA: Diagnosis not present

## 2018-09-04 DIAGNOSIS — K50113 Crohn's disease of large intestine with fistula: Secondary | ICD-10-CM | POA: Diagnosis not present

## 2018-09-04 MED ORDER — NYSTATIN-TRIAMCINOLONE 100000-0.1 UNIT/GM-% EX CREA: 1.0000 "application " | TOPICAL_CREAM | Freq: Two times a day (BID) | CUTANEOUS | 1 refills | Status: AC

## 2018-09-04 MED ORDER — CIPROFLOXACIN HCL 500 MG PO TABS: 500.0000 mg | ORAL_TABLET | Freq: Two times a day (BID) | ORAL | 0 refills | Status: DC

## 2018-09-04 MED ORDER — PREDNISONE 10 MG PO TABS: 30.0000 mg | ORAL_TABLET | Freq: Every day | ORAL | 0 refills | Status: DC

## 2018-09-04 NOTE — Progress Notes (Signed)
Presenting complaint;  For follow-up of complicated Crohn's disease.  Database and subjective:  Patient is 58 year old Caucasian male who has 16-year history of inflammatory bowel disease.  He was initially diagnosed with ulcerative colitis back in 2004.  Back in 2010 he underwent colonoscopy had [facility] and he was felt to have Crohn's colitis.  He has been under my care since January 2017.  He did require seton placement by Dr. Cheryll Cockayne of [facility] in April 2018.  Seton fell spontaneously a few months ago. He has been on vedolizumab since December 2019.  Dose change was requested recently and has been approved by his insurance so that he will receive infusion every 4 weeks.  On his last visit 8 weeks ago he was given prescription for metronidazole for active perianal disease. He was seen in emergency room on 08/19/2018 because of low-grade fever increased drainage from perianal fistulae as well as diarrhea.  Abdominal pelvic CT revealed 2 new small abscesses.  CT did not show small bowel involvement but there was hyperemia to sigmoid mesocolon with mild mucosal hyperenhancement suggestive of active disease involving sigmoid colon. GI pathogen panel and C. difficile test negative.  Patient was discharged on Augmentin which she only took for 1 week.  He is here today accompanied by his wife [name] who is an Therapist, sports. He feels poorly.  He has lost 6 pounds.  He has low-grade temperature off and on.  Today's temp was 99.2.  He is having multiple loose stools.  His averages 6-8 stools per day.  Yesterday he had 9 stools.  He has noted pain in the right perianal region where he has felt a bump.  He has noted drainage of mucopurulent material from 2 of the fistula.  He has noted small amount of blood which she feels is coming from fistula but he has not experienced frank rectal bleeding.  He also complains of discomfort in coccygeal region.  His appetite is not good.  He denies dysuria or increased urinary  frequency.   Current Medications: Outpatient Encounter Medications as of 09/04/2018  Medication Sig  . cholecalciferol (VITAMIN D) 1000 units tablet Take 5,000 Units by mouth daily.   Marland Kitchen nystatin cream (MYCOSTATIN) Apply 1 application topically 2 (two) times daily.  . Omega-3 Fatty Acids (FISH OIL) 1200 MG CAPS Take 2 capsules by mouth 2 (two) times daily.  . Probiotic Product (PROBIOTIC DAILY PO) Take 1 capsule by mouth daily.  . ciprofloxacin (CIPRO) 500 MG tablet Take 1 tablet (500 mg total) by mouth 2 (two) times daily. (Patient not taking: Reported on 08/19/2018)  . metroNIDAZOLE (FLAGYL) 250 MG tablet Take 1 tablet (250 mg total) by mouth 3 (three) times daily. (Patient not taking: Reported on 08/19/2018)  . metroNIDAZOLE (FLAGYL) 250 MG tablet Take 1 tablet (250 mg total) by mouth 3 (three) times daily. (Patient not taking: Reported on 08/19/2018)  . vedolizumab (ENTYVIO) 300 MG injection Inject 300 mg into the vein.  . [DISCONTINUED] amoxicillin-clavulanate (AUGMENTIN) 875-125 MG tablet Take 1 tablet by mouth every 12 (twelve) hours. (Patient not taking: Reported on 09/04/2018)   Facility-Administered Encounter Medications as of 09/04/2018  Medication  . gadobenate dimeglumine (MULTIHANCE) injection 20 mL     Objective: Blood pressure 118/77, pulse 98, temperature 99.2 F (37.3 C), temperature source Oral, resp. rate 18, height 6' 2"  (1.88 m), weight 213 lb 1.6 oz (96.7 kg). Patient is alert and in no acute distress. He appears worried. Conjunctiva is pink. Sclera is nonicteric Oropharyngeal mucosa is  normal. No neck masses or thyromegaly noted. Cardiac exam with regular rhythm normal S1 and S2. No murmur or gallop noted. Lungs are clear to auscultation. Abdomen is flat.  Bowel sounds are normal.  On palpation he has mild tenderness in left lower quadrant of his abdomen.  No organomegaly or masses. Rectal examination was limited to external exam only.  There is erythema the skin  between the anal opening and coccyx and there is a small crack close to coccyx which appears to be superficial.  There is another indurated area close to the coccyx without an opening.  Gauze is soiled with mucopurulent material.  There is indurated area to the right of anal orifice years drainage of mucopurulent material from fistula on the right as well as left.  He has total of 5 fistulae that I could see.  There is erythema to perianal skin. No LE edema or clubbing noted.  Labs/studies Results:  CBC Latest Ref Rng & Units 08/19/2018 12/31/2017 07/09/2017  WBC 4.0 - 10.5 K/uL 8.3 9.5 7.4  Hemoglobin 13.0 - 17.0 g/dL 14.1 14.3 14.6  Hematocrit 39.0 - 52.0 % 44.3 43.4 43.5  Platelets 150 - 400 K/uL 366 366 318    CMP Latest Ref Rng & Units 08/19/2018 12/31/2017 07/09/2017  Glucose 70 - 99 mg/dL 102(H) 82 -  BUN 6 - 20 mg/dL 11 9 -  Creatinine 0.61 - 1.24 mg/dL 0.99 0.96 -  Sodium 135 - 145 mmol/L 136 137 -  Potassium 3.5 - 5.1 mmol/L 3.7 4.2 -  Chloride 98 - 111 mmol/L 104 101 -  CO2 22 - 32 mmol/L 26 29 -  Calcium 8.9 - 10.3 mg/dL 8.9 9.5 -  Total Protein 6.5 - 8.1 g/dL 8.4(H) 8.0 7.9  Total Bilirubin 0.3 - 1.2 mg/dL 1.5(H) 0.9 0.9  Alkaline Phos 38 - 126 U/L 83 - -  AST 15 - 41 U/L 16 14 26   ALT 0 - 44 U/L 15 13 36    Hepatic Function Latest Ref Rng & Units 08/19/2018 12/31/2017 07/09/2017  Total Protein 6.5 - 8.1 g/dL 8.4(H) 8.0 7.9  Albumin 3.5 - 5.0 g/dL 3.8 - -  AST 15 - 41 U/L 16 14 26   ALT 0 - 44 U/L 15 13 36  Alk Phosphatase 38 - 126 U/L 83 - -  Total Bilirubin 0.3 - 1.2 mg/dL 1.5(H) 0.9 0.9  Bilirubin, Direct 0.0 - 0.2 mg/dL - - 0.1    Lab Results  Component Value Date   CRP 4.5 (H) 08/19/2018        Sed rate was 38(range 0-60)       GI pathogen panel was negative on 08/19/2018       C. difficile antigen was negative.  C. difficile toxin was also negative.  I have reviewed abdominal pelvic CT from 08/19/2018  Impression as follows. Mild hyperemia of the sigmoid mesocolon  with probable mild mucosal hyperenhancement of the sigmoid colon as well, findings suggestive of subtle sigmoid colitis, such as from Crohn's disease.  Fat deposition within the wall of the sigmoid colon which may be related to prior inflammation.  Perianal fistula extending bilaterally into the medial buttocks where small fluid collections are seen question small abscesses as above.  Cholelithiasis.  Assessment:  #1.  Colonic Crohn's disease along with perianal fistulae and recurrent abscesses.  Last colonoscopy was in January 2017.  Perianal disease seem to have worsened.  Patient has never been in remission although he has managed to work full-time  despite his symptoms.  Now he is reached the point that he is having difficulty because of constitutional symptoms perianal drainage and pain. Patient's prior therapy has been fairly extensive and includes the following. He developed arthralgias with azathioprine. He has been on infliximab and adalimumab without meaningful benefit. More recently he has been on Ustekinumab, tofacitinib and currently on vedolizumab. In summary he has tried to anti-TNF agents, 1 interleukin monoclonal antibody, 1 JAK inhibitor and now he is on alpha 4B7 integrin antibody and not doing well.  Options at this time include adding methotrexate or consider loop ileostomy until colonic and perianal disease has healed.  He may also be a candidate for newer agents which are not available for clinical use.  #2.  Asymptomatic cholelithiasis.  Plan:  Begin metronidazole 250 mg by mouth 3 times a day and unless he has side effects he will increase the dose to 500 mg 3 times a day after 3 to 4 days. Cipro 500 mg by mouth twice daily.  He will start Cipro 3 days after starting metronidazole. Prednisone 30 mg daily for 1 week and thereafter dropped dose by 5 mg every week. Mycolog-II cream to be applied to perianal skin twice daily for a week and thereafter on as-needed  basis. Patient has been placed on medical leave. We will confer with Dr. Cheryll Cockayne of [facility]. We will also check with [facility] group if he is a candidate for 1 of the newer agents. Office visit in 4 weeks.

## 2018-09-04 NOTE — Patient Instructions (Signed)
Apply Mycolog-II cream to perianal area twice daily until skin rash resolved and thereafter as needed Prednisone 30 mg by mouth daily for 1 week and thereafter drop prednisone dose by 5 mg every week.

## 2018-09-05 ENCOUNTER — Encounter (INDEPENDENT_AMBULATORY_CARE_PROVIDER_SITE_OTHER): Payer: Self-pay

## 2018-09-05 ENCOUNTER — Encounter (INDEPENDENT_AMBULATORY_CARE_PROVIDER_SITE_OTHER): Payer: Self-pay | Admitting: *Deleted

## 2018-09-13 ENCOUNTER — Telehealth (INDEPENDENT_AMBULATORY_CARE_PROVIDER_SITE_OTHER): Payer: Self-pay | Admitting: Internal Medicine

## 2018-09-13 NOTE — Telephone Encounter (Signed)
Patient's condition discussed with Dr. Cheryll Cockayne of [facility]. He will kindly see patient on next week Thursday.  His office will contacted patient directly. Recent CT would be powered shared with [facility].

## 2018-09-23 ENCOUNTER — Encounter (INDEPENDENT_AMBULATORY_CARE_PROVIDER_SITE_OTHER): Payer: Self-pay

## 2018-09-30 ENCOUNTER — Other Ambulatory Visit (INDEPENDENT_AMBULATORY_CARE_PROVIDER_SITE_OTHER): Payer: Self-pay | Admitting: Internal Medicine

## 2018-10-20 ENCOUNTER — Encounter: Payer: Self-pay | Admitting: Internal Medicine

## 2018-10-22 ENCOUNTER — Other Ambulatory Visit: Payer: Self-pay

## 2018-10-22 ENCOUNTER — Ambulatory Visit (INDEPENDENT_AMBULATORY_CARE_PROVIDER_SITE_OTHER): Payer: 59 | Admitting: Internal Medicine

## 2018-10-22 ENCOUNTER — Encounter (INDEPENDENT_AMBULATORY_CARE_PROVIDER_SITE_OTHER): Payer: Self-pay | Admitting: Internal Medicine

## 2018-10-22 VITALS — BP 116/77 | HR 87 | Temp 98.3°F | Ht 74.0 in | Wt 215.7 lb

## 2018-10-22 DIAGNOSIS — K50114 Crohn's disease of large intestine with abscess: Secondary | ICD-10-CM | POA: Diagnosis not present

## 2018-10-22 DIAGNOSIS — K50119 Crohn's disease of large intestine with unspecified complications: Secondary | ICD-10-CM | POA: Diagnosis not present

## 2018-10-22 NOTE — Patient Instructions (Signed)
Next blood work in 1 month.

## 2018-10-22 NOTE — Progress Notes (Signed)
Presenting complaint;  Follow-up for colonic and perianal fistulas and Crohn's disease.  Database and subjective:  Cory [name] is 58 year old Caucasian male who has refractory Crohn's disease unresponsive to multiple agents who was referred back to Dr. Cheryll Cockayne for consideration of ileostomy to help heal fistulas and Crohn's disease.  Patient was seen by Dr. Morton Stall who felt if he is going to have surgery he should consider proctocolectomy since he has disease only involving large bowel.  At his request patient was seen by Dr. [name] who felt ileostomy would be preferable to proctocolectomy as it may buy him some time until newer agents are available.  Since patient had never been treated with Certolizumab he recommended trying this medication.  He also considered methotrexate but patient and his wife are scared to take this medication.  Patient states he took first dose of Certolizumab on 03/03/5425.  He will receive 2 more doses every 2 weeks and thereafter he will be on dose every 4 weeks.  He did not experience any side effects.  He is still having drainage from perianal fistula.  Few days ago he had chills and temp of 99.3 but it resolved spontaneously.  Last prednisone dose was on 10/11/2018.  He remains with diarrhea.  On most days he has 6 stools per day.  He noticed some bleeding from fistula but not with his bowel movements.  His appetite is fair.  He has gained 2 pounds since his last visit.  He has lost over 10 pounds prior to that visit. His wife states he has developed another fistula of a procedure close to coccyx.  Current Medications: Outpatient Encounter Medications as of 10/22/2018  Medication Sig  . Certolizumab Pegol (CIMZIA [place]) Inject 400 mg into the skin. Started on 09/14//2020. Loading dose - 400 mg every 2 weeks for the first 3 doses. Then he will inject 400 mg SQ once a month.  . cholecalciferol (VITAMIN D) 1000 units tablet Take 5,000 Units by mouth daily.   Marland Kitchen  nystatin cream (MYCOSTATIN) Apply 1 application topically 2 (two) times daily.  Marland Kitchen nystatin-triamcinolone (MYCOLOG II) cream Apply 1 application topically 2 (two) times daily.  . Omega-3 Fatty Acids (FISH OIL) 1200 MG CAPS Take 2 capsules by mouth 2 (two) times daily.  . Probiotic Product (PROBIOTIC DAILY PO) Take 1 capsule by mouth daily.  . rosuvastatin (CRESTOR) 10 MG tablet Take 10 mg by mouth daily.  . vedolizumab (ENTYVIO) 300 MG injection Inject 300 mg into the vein.  . [DISCONTINUED] ciprofloxacin (CIPRO) 500 MG tablet Take 1 tablet (500 mg total) by mouth 2 (two) times daily. (Patient not taking: Reported on 10/22/2018)  . [DISCONTINUED] predniSONE (DELTASONE) 10 MG tablet TAKE 3 TABLETS BY MOUTH EVERY DAY WITH BREAKFAST (Patient not taking: Reported on 10/22/2018)   Facility-Administered Encounter Medications as of 10/22/2018  Medication  . gadobenate dimeglumine (MULTIHANCE) injection 20 mL     Objective: Blood pressure 116/77, pulse 87, temperature 98.3 F (36.8 C), temperature source Oral, height 6' 2"  (1.88 m), weight 215 lb 11.2 oz (97.8 kg). Patient is alert and in no acute distress. Conjunctiva is pink. Sclera is nonicteric Oropharyngeal mucosa is normal. No neck masses or thyromegaly noted. Cardiac exam with regular rhythm normal S1 and S2. No murmur or gallop noted. Lungs are clear to auscultation. Abdomen is symmetrical and soft.  He has mild tenderness in both iliac fossae.  No organomegaly or masses.  Rectal examination was limited to external inspection.  He has 3 fistula  on the left side 2 on the right and 1 very posterior.  Some of these fistulas are draining mucopurulent liquid. No LE edema or clubbing noted.  Labs/studies Results:  CBC Latest Ref Rng & Units 08/19/2018 12/31/2017 07/09/2017  WBC 4.0 - 10.5 K/uL 8.3 9.5 7.4  Hemoglobin 13.0 - 17.0 g/dL 14.1 14.3 14.6  Hematocrit 39.0 - 52.0 % 44.3 43.4 43.5  Platelets 150 - 400 K/uL 366 366 318    CMP Latest Ref  Rng & Units 08/19/2018 12/31/2017 07/09/2017  Glucose 70 - 99 mg/dL 102(H) 82 -  BUN 6 - 20 mg/dL 11 9 -  Creatinine 0.61 - 1.24 mg/dL 0.99 0.96 -  Sodium 135 - 145 mmol/L 136 137 -  Potassium 3.5 - 5.1 mmol/L 3.7 4.2 -  Chloride 98 - 111 mmol/L 104 101 -  CO2 22 - 32 mmol/L 26 29 -  Calcium 8.9 - 10.3 mg/dL 8.9 9.5 -  Total Protein 6.5 - 8.1 g/dL 8.4(H) 8.0 7.9  Total Bilirubin 0.3 - 1.2 mg/dL 1.5(H) 0.9 0.9  Alkaline Phos 38 - 126 U/L 83 - -  AST 15 - 41 U/L 16 14 26   ALT 0 - 44 U/L 15 13 36    Hepatic Function Latest Ref Rng & Units 08/19/2018 12/31/2017 07/09/2017  Total Protein 6.5 - 8.1 g/dL 8.4(H) 8.0 7.9  Albumin 3.5 - 5.0 g/dL 3.8 - -  AST 15 - 41 U/L 16 14 26   ALT 0 - 44 U/L 15 13 36  Alk Phosphatase 38 - 126 U/L 83 - -  Total Bilirubin 0.3 - 1.2 mg/dL 1.5(H) 0.9 0.9  Bilirubin, Direct 0.0 - 0.2 mg/dL - - 0.1     Assessment:  #1.  Refractory Crohn's disease involving: Perianal region with multiple fistulae.  He has not responded to to TNF antibodies(infliximab and adalimumab), 1 integrin antibody(vedolizumab), one interleukin blocker(Ustekinumab) and one JAK inhibitor(tofacitinib) and now he is on Certolizumab.  He has been intolerant of 6-MP.  Unfortunately he has not been in remission since I have been working with him. I agree with Dr. Seward Meth recommendations and hope this is the right drug for him.  Plan:  Follow-up with Dr. Renee Harder as planned. Patient will have CBC with differential, comprehensive chemistry panel, sed rate, CRP and fecal calprotectin. Office visit in 3 months.

## 2018-10-25 ENCOUNTER — Other Ambulatory Visit (INDEPENDENT_AMBULATORY_CARE_PROVIDER_SITE_OTHER): Payer: Self-pay | Admitting: *Deleted

## 2018-10-25 DIAGNOSIS — K50114 Crohn's disease of large intestine with abscess: Secondary | ICD-10-CM

## 2018-11-13 ENCOUNTER — Other Ambulatory Visit (INDEPENDENT_AMBULATORY_CARE_PROVIDER_SITE_OTHER): Payer: Self-pay | Admitting: Internal Medicine

## 2018-11-14 LAB — C-REACTIVE PROTEIN: CRP: 29.9 mg/L — ABNORMAL HIGH (ref <8.0)

## 2018-11-14 LAB — CBC WITH DIFFERENTIAL/PLATELET
Absolute Monocytes: 1345 cells/uL — ABNORMAL HIGH (ref 200–950)
Basophils Absolute: 68 cells/uL (ref 0–200)
Basophils Relative: 0.6 %
Eosinophils Absolute: 283 cells/uL (ref 15–500)
Eosinophils Relative: 2.5 %
HCT: 41.8 % (ref 38.5–50.0)
Hemoglobin: 13.5 g/dL (ref 13.2–17.1)
Lymphs Abs: 2384 cells/uL (ref 850–3900)
MCH: 26.9 pg — ABNORMAL LOW (ref 27.0–33.0)
MCHC: 32.3 g/dL (ref 32.0–36.0)
MCV: 83.3 fL (ref 80.0–100.0)
MPV: 8.7 fL (ref 7.5–12.5)
Monocytes Relative: 11.9 %
Neutro Abs: 7221 cells/uL (ref 1500–7800)
Neutrophils Relative %: 63.9 %
Platelets: 488 10*3/uL — ABNORMAL HIGH (ref 140–400)
RBC: 5.02 10*6/uL (ref 4.20–5.80)
RDW: 14 % (ref 11.0–15.0)
Total Lymphocyte: 21.1 %
WBC: 11.3 10*3/uL — ABNORMAL HIGH (ref 3.8–10.8)

## 2018-11-14 LAB — SEDIMENTATION RATE: Sed Rate: 51 mm/h — ABNORMAL HIGH (ref 0–20)

## 2018-11-14 LAB — COMPREHENSIVE METABOLIC PANEL
AG Ratio: 1.1 (calc) (ref 1.0–2.5)
ALT: 10 U/L (ref 9–46)
AST: 12 U/L (ref 10–35)
Albumin: 4 g/dL (ref 3.6–5.1)
Alkaline phosphatase (APISO): 89 U/L (ref 35–144)
BUN: 9 mg/dL (ref 7–25)
CO2: 28 mmol/L (ref 20–32)
Calcium: 9.5 mg/dL (ref 8.6–10.3)
Chloride: 103 mmol/L (ref 98–110)
Creat: 0.91 mg/dL (ref 0.70–1.33)
Globulin: 3.7 g/dL (calc) (ref 1.9–3.7)
Glucose, Bld: 72 mg/dL (ref 65–139)
Potassium: 4.3 mmol/L (ref 3.5–5.3)
Sodium: 138 mmol/L (ref 135–146)
Total Bilirubin: 0.8 mg/dL (ref 0.2–1.2)
Total Protein: 7.7 g/dL (ref 6.1–8.1)

## 2018-11-16 LAB — IFOBT (OCCULT BLOOD): IFOBT: POSITIVE

## 2018-11-19 LAB — CALPROTECTIN: Calprotectin: 1300 mcg/g — ABNORMAL HIGH

## 2019-01-07 ENCOUNTER — Ambulatory Visit (INDEPENDENT_AMBULATORY_CARE_PROVIDER_SITE_OTHER): Payer: 59 | Admitting: Internal Medicine

## 2019-01-21 ENCOUNTER — Other Ambulatory Visit: Payer: Self-pay

## 2019-01-21 ENCOUNTER — Encounter (INDEPENDENT_AMBULATORY_CARE_PROVIDER_SITE_OTHER): Payer: Self-pay | Admitting: Internal Medicine

## 2019-01-21 ENCOUNTER — Ambulatory Visit (INDEPENDENT_AMBULATORY_CARE_PROVIDER_SITE_OTHER): Payer: 59 | Admitting: Internal Medicine

## 2019-01-21 VITALS — BP 102/70 | HR 120 | Temp 97.9°F | Ht 74.0 in | Wt 210.8 lb

## 2019-01-21 DIAGNOSIS — K50114 Crohn's disease of large intestine with abscess: Secondary | ICD-10-CM

## 2019-01-21 DIAGNOSIS — R531 Weakness: Secondary | ICD-10-CM

## 2019-01-21 MED ORDER — METRONIDAZOLE 250 MG PO TABS: 250.0000 mg | ORAL_TABLET | Freq: Three times a day (TID) | ORAL | 0 refills | Status: DC

## 2019-01-21 MED ORDER — CIPROFLOXACIN HCL 500 MG PO TABS: 500.0000 mg | ORAL_TABLET | Freq: Two times a day (BID) | ORAL | 0 refills | Status: DC

## 2019-01-21 NOTE — Progress Notes (Signed)
Presenting complaint;  Follow-up for colonic and perianal fistulizing Crohn's disease.  Database and subjective:  Patient is 58 year old Caucasian male with refractory Crohn's disease who has failed failed medical therapy.  He did not tolerate 6-MP.  He did not respond to two anti-TNF antibodies(infliximab and adalimumab), integrin antibody(vedolizumab), interleukin antibody to IL 12 and 23(ustekinumab) as well as JAK inhibitor(tofacitinib).  He had seton placement by Dr. Cheryll Cockayne last year and seton fell out this year.  I recommended surgical intervention.  I felt he may benefit from ileostomy.  We also considered possibility of proctocolectomy.  Patient was seen by Dr. [name] of [facility] and was begun on certolizumab pegol.  Patient took a total of 4 doses but could not tell any difference.  He also considered adding methotrexate but patient and his wife were reluctant to use this medication for fear of side effects. And therefore went on to have ileostomy on 12/16/2018. He now returns for follow-up visit accompanied by his wife [name]. Patient states he does not feel well at all.  He feels very weak.  He says he walks to his mailbox and on his way back his legs get very weak and his steps get smaller but he has not passed out.  He reports ileostomy output to be less than a liter.  He feels he is drinking a lot of fluids.  Stool is loose and watery in the morning and pasty towards the end of the day.  He has not passed any blood or tarry stool into ileostomy.  He is still getting used to it.  He states he has joined [employer] [facility] and he has learned that what he is going through his not unusual and he will just bounce back 1 day.  He denies abdominal pain.  He remains with drainage from perianal fistula.  When drainage stops he has pain.  He is still having to use the bathroom 4-7 times a day.  He has not passed any stool per rectum.  He has lost 5 pounds since surgery.  His appetite is  good.  He denies fever chills or night sweats.  He also denies chest pain or shortness of breath. He did not resume Certolizumab after surgery. He has not experienced any side effects with Cipro and metronidazole that he has used intermittently in the past.  Current Medications: Outpatient Encounter Medications as of 01/21/2019  Medication Sig  . Ascorbic Acid (VITAMIN C) 500 MG CHEW Chew by mouth daily.  . cholecalciferol (VITAMIN D) 1000 units tablet Take 5,000 Units by mouth daily.   . Multiple Vitamins-Minerals (ZINC PO) Take by mouth daily.  Marland Kitchen nystatin cream (MYCOSTATIN) Apply 1 application topically 2 (two) times daily.  Marland Kitchen nystatin-triamcinolone (MYCOLOG II) cream Apply 1 application topically 2 (two) times daily.  Marland Kitchen OVER THE COUNTER MEDICATION Adrenal Support -  Patient states that he takes one daily.  . Probiotic Product (PROBIOTIC DAILY PO) Take 1 capsule by mouth daily.  . rosuvastatin (CRESTOR) 10 MG tablet Take 10 mg by mouth daily.  . Certolizumab Pegol (CIMZIA [place]) Inject 400 mg into the skin. Started on 09/14//2020. Loading dose - 400 mg every 2 weeks for the first 3 doses. Then he will inject 400 mg SQ once a month.  . Omega-3 Fatty Acids (FISH OIL) 1200 MG CAPS Take 2 capsules by mouth 2 (two) times daily.  . [DISCONTINUED] vedolizumab (ENTYVIO) 300 MG injection Inject 300 mg into the vein.   Facility-Administered Encounter Medications as of  01/21/2019  Medication  . gadobenate dimeglumine (MULTIHANCE) injection 20 mL     Objective: Blood pressure 102/70, pulse (!) 120, temperature 97.9 F (36.6 C), height 6' 2"  (1.88 m), weight 210 lb 12.8 oz (95.6 kg). Patient is alert and in no acute distress. Conjunctiva is pink. Sclera is nonicteric Oropharyngeal mucosa is normal. No neck masses or thyromegaly noted. Cardiac exam with regular rhythm normal S1 and S2. No murmur or gallop noted. Lungs are clear to auscultation. Abdomen is symmetrical.  He has ileostomy in right  mid abdomen.  Ileostomy bag has thick or pasty stool.  It is brownish in color.  Bowel sounds are normal.  Abdomen is soft with mild tenderness at LLQ. Rectal examination was limited to external exam only.  Perianal fistula located posteriorly in the midline close to coccyx has healed.  There is no tenderness in that area.  There is drainage from 4 of these fistulae.  2 were to the left and towards the right of anal orifice.  There is induration between these fistulae and anal verge on the left side. No LE edema or clubbing noted.  Labs/studies Results:  CBC Latest Ref Rng & Units 11/13/2018 08/19/2018 12/31/2017  WBC 3.8 - 10.8 Thousand/uL 11.3(H) 8.3 9.5  Hemoglobin 13.2 - 17.1 g/dL 13.5 14.1 14.3  Hematocrit 38.5 - 50.0 % 41.8 44.3 43.4  Platelets 140 - 400 Thousand/uL 488(H) 366 366    CMP Latest Ref Rng & Units 11/13/2018 08/19/2018 12/31/2017  Glucose 65 - 139 mg/dL 72 102(H) 82  BUN 7 - 25 mg/dL 9 11 9   Creatinine 0.70 - 1.33 mg/dL 0.91 0.99 0.96  Sodium 135 - 146 mmol/L 138 136 137  Potassium 3.5 - 5.3 mmol/L 4.3 3.7 4.2  Chloride 98 - 110 mmol/L 103 104 101  CO2 20 - 32 mmol/L 28 26 29   Calcium 8.6 - 10.3 mg/dL 9.5 8.9 9.5  Total Protein 6.1 - 8.1 g/dL 7.7 8.4(H) 8.0  Total Bilirubin 0.2 - 1.2 mg/dL 0.8 1.5(H) 0.9  Alkaline Phos 38 - 126 U/L - 83 -  AST 10 - 35 U/L 12 16 14   ALT 9 - 46 U/L 10 15 13     Hepatic Function Latest Ref Rng & Units 11/13/2018 08/19/2018 12/31/2017  Total Protein 6.1 - 8.1 g/dL 7.7 8.4(H) 8.0  Albumin 3.5 - 5.0 g/dL - 3.8 -  AST 10 - 35 U/L 12 16 14   ALT 9 - 46 U/L 10 15 13   Alk Phosphatase 38 - 126 U/L - 83 -  Total Bilirubin 0.2 - 1.2 mg/dL 0.8 1.5(H) 0.9  Bilirubin, Direct 0.0 - 0.2 mg/dL - - -    Lab Results  Component Value Date   CRP 29.9 (H) 11/13/2018      Assessment:  #1.  Colonic and perianal fistulizing Crohn's disease which has been refractory to multiple medications as above.  He underwent ileostomy for fecal diversion on  12/16/2018.  He remains with perianal drainage.  Only one of the fistulae has healed completely.  I am not sure why he is not back on biologic.  I will check with Dr. Renee Harder to make sure that there is no contraindication. In the meantime I feel he needs to be back on antibiotic.  #2.  Weakness.  His resting heart rate is 120.  It was rechecked.  He has no history of SVT but this needs to be ruled out.  I wonder if he has mild dehydration.  Will check blood work and  he would also have an EKG at the hospital.   Plan:  Patient encouraged to drink more fluids. He will go to the lab for CBC with differential and comprehensive chemistry panel. EKG at the hospital. Cipro 500 mg p.o. twice daily for 2 weeks. Metronidazole to 50 mg p.o. 3 times a day.  Prescription given for 30 days. Consider resuming certolizumab pegol.  Will check with Dr. Elza Rafter of [facility]. Office visit in 1 month.

## 2019-01-21 NOTE — Patient Instructions (Signed)
Patient did have an EKG today. Physician will call with results of blood test.

## 2019-01-22 LAB — COMPREHENSIVE METABOLIC PANEL
AG Ratio: 1 (calc) (ref 1.0–2.5)
ALT: 26 U/L (ref 9–46)
AST: 21 U/L (ref 10–35)
Albumin: 3.9 g/dL (ref 3.6–5.1)
Alkaline phosphatase (APISO): 120 U/L (ref 35–144)
BUN: 10 mg/dL (ref 7–25)
CO2: 25 mmol/L (ref 20–32)
Calcium: 9.4 mg/dL (ref 8.6–10.3)
Chloride: 101 mmol/L (ref 98–110)
Creat: 1.07 mg/dL (ref 0.70–1.33)
Globulin: 4 g/dL (calc) — ABNORMAL HIGH (ref 1.9–3.7)
Glucose, Bld: 92 mg/dL (ref 65–139)
Potassium: 4.6 mmol/L (ref 3.5–5.3)
Sodium: 137 mmol/L (ref 135–146)
Total Bilirubin: 0.7 mg/dL (ref 0.2–1.2)
Total Protein: 7.9 g/dL (ref 6.1–8.1)

## 2019-01-22 LAB — CBC WITH DIFFERENTIAL/PLATELET
Absolute Monocytes: 959 cells/uL — ABNORMAL HIGH (ref 200–950)
Basophils Absolute: 53 cells/uL (ref 0–200)
Basophils Relative: 0.6 %
Eosinophils Absolute: 299 cells/uL (ref 15–500)
Eosinophils Relative: 3.4 %
HCT: 39.8 % (ref 38.5–50.0)
Hemoglobin: 12.8 g/dL — ABNORMAL LOW (ref 13.2–17.1)
Lymphs Abs: 1619 cells/uL (ref 850–3900)
MCH: 25.5 pg — ABNORMAL LOW (ref 27.0–33.0)
MCHC: 32.2 g/dL (ref 32.0–36.0)
MCV: 79.4 fL — ABNORMAL LOW (ref 80.0–100.0)
MPV: 8.6 fL (ref 7.5–12.5)
Monocytes Relative: 10.9 %
Neutro Abs: 5870 cells/uL (ref 1500–7800)
Neutrophils Relative %: 66.7 %
Platelets: 525 10*3/uL — ABNORMAL HIGH (ref 140–400)
RBC: 5.01 10*6/uL (ref 4.20–5.80)
RDW: 13.5 % (ref 11.0–15.0)
Total Lymphocyte: 18.4 %
WBC: 8.8 10*3/uL (ref 3.8–10.8)

## 2019-01-24 ENCOUNTER — Other Ambulatory Visit (INDEPENDENT_AMBULATORY_CARE_PROVIDER_SITE_OTHER): Payer: Self-pay | Admitting: Internal Medicine

## 2019-01-29 ENCOUNTER — Encounter (INDEPENDENT_AMBULATORY_CARE_PROVIDER_SITE_OTHER): Payer: Self-pay | Admitting: *Deleted

## 2019-01-29 ENCOUNTER — Telehealth (INDEPENDENT_AMBULATORY_CARE_PROVIDER_SITE_OTHER): Payer: Self-pay | Admitting: Internal Medicine

## 2019-01-29 NOTE — Telephone Encounter (Signed)
Patient was sent a MY Chart response.

## 2019-01-29 NOTE — Telephone Encounter (Signed)
Per Dr.Rehman - patient may take the injection.

## 2019-01-29 NOTE — Telephone Encounter (Signed)
Patient called stated he has a couple of Cemzia left - would like to know if it would be ok to take - please advise by [employer]

## 2019-02-12 ENCOUNTER — Encounter (INDEPENDENT_AMBULATORY_CARE_PROVIDER_SITE_OTHER): Payer: Self-pay

## 2019-02-13 ENCOUNTER — Other Ambulatory Visit (INDEPENDENT_AMBULATORY_CARE_PROVIDER_SITE_OTHER): Payer: Self-pay | Admitting: *Deleted

## 2019-02-17 ENCOUNTER — Encounter (INDEPENDENT_AMBULATORY_CARE_PROVIDER_SITE_OTHER): Payer: Self-pay

## 2019-02-24 ENCOUNTER — Other Ambulatory Visit: Payer: Self-pay

## 2019-02-24 ENCOUNTER — Encounter (INDEPENDENT_AMBULATORY_CARE_PROVIDER_SITE_OTHER): Payer: Self-pay | Admitting: Internal Medicine

## 2019-02-24 ENCOUNTER — Ambulatory Visit (INDEPENDENT_AMBULATORY_CARE_PROVIDER_SITE_OTHER): Payer: 59 | Admitting: Internal Medicine

## 2019-02-24 VITALS — BP 114/77 | HR 81 | Temp 98.1°F | Ht 74.0 in | Wt 218.8 lb

## 2019-02-24 DIAGNOSIS — D500 Iron deficiency anemia secondary to blood loss (chronic): Secondary | ICD-10-CM

## 2019-02-24 DIAGNOSIS — K50114 Crohn's disease of large intestine with abscess: Secondary | ICD-10-CM

## 2019-02-24 DIAGNOSIS — D509 Iron deficiency anemia, unspecified: Secondary | ICD-10-CM | POA: Insufficient documentation

## 2019-02-24 DIAGNOSIS — K50118 Crohn's disease of large intestine with other complication: Secondary | ICD-10-CM

## 2019-02-24 NOTE — Progress Notes (Signed)
Presenting complaint;  Follow-up for refractory Crohn's disease involving colon and perianal fistulizing disease.    Database and subjective:  Patient is 59 year old Caucasian male who has Crohn's disease involving colon and perianal disease with fistulae who has failed multiple therapies.  I felt he may require fecal diversion so that perianal disease could heal.  Dr. Morton Stall wondered if he would do better with proctocolectomy.  Patient was seen by Dr. Renee Harder who recommended ileostomy while waiting for newer agents. He also start him on Cimzia certolizumab pegol.  He had ileostomy on 12/16/2018.  Biologic was on hold until his last visit of 01/21/2019.  On his last visit he was also given prescription for Cipro and metronidazole.  He finished Cipro about 2 weeks ago and metronidazole last week and since he had skipped few doses. On his last visit he was complaining of extreme weakness limiting his physical activity.  He is here for scheduled visit accompanied by his wife [name]. He is definitely feeling better.  His weakness has decreased.  His stamina is still not at baseline.  But he is able to do more walking and physical activity.  His appetite is very good.  He has gained 8 pounds since his last visit.  He says ileostomy output has not changed.  He has not seen any blood or tarry stool. He has noted decrease in drainage from perianal fistulae.  He says some of these have healed.  He is noticing clear mucus and he is to clean himself 4 times a day.  He has not passed any blood per rectum.  He also is not having any pain when he sits in a chair.  Only time he has some discomfort if he pushes hard. He took first and second dose of Cimzia and will have third dose next week and thereafter every month. Patient states he is take a multivitamin and he still has 9 mg of iron. His wife states that he has applied for disability.    Current Medications: Outpatient Encounter Medications as of  02/24/2019  Medication Sig  . Ascorbic Acid (VITAMIN C) 500 MG CHEW Chew by mouth daily.  . Certolizumab Pegol (CIMZIA [place]) Inject 400 mg into the skin every 30 (thirty) days. Inject 400 mg SQ Monthly  . cholecalciferol (VITAMIN D) 1000 units tablet Take 5,000 Units by mouth daily.   . Melatonin 3 MG CAPS Take by mouth at bedtime.  Marland Kitchen nystatin cream (MYCOSTATIN) Apply 1 application topically 2 (two) times daily.  Marland Kitchen nystatin-triamcinolone (MYCOLOG II) cream Apply 1 application topically 2 (two) times daily.  Marland Kitchen OVER THE COUNTER MEDICATION Adrenal Support -  Patient states that he takes one daily.  . Probiotic Product (PROBIOTIC DAILY PO) Take 1 capsule by mouth daily.  . rosuvastatin (CRESTOR) 10 MG tablet Take 10 mg by mouth daily.  . ciprofloxacin (CIPRO) 500 MG tablet Take 1 tablet (500 mg total) by mouth 2 (two) times daily. (Patient not taking: Reported on 02/24/2019)  . metroNIDAZOLE (FLAGYL) 250 MG tablet Take 1 tablet (250 mg total) by mouth 3 (three) times daily. (Patient not taking: Reported on 02/24/2019)  . [DISCONTINUED] Multiple Vitamins-Minerals (ZINC PO) Take by mouth daily.  . [DISCONTINUED] Omega-3 Fatty Acids (FISH OIL) 1200 MG CAPS Take 2 capsules by mouth 2 (two) times daily.   Facility-Administered Encounter Medications as of 02/24/2019  Medication  . gadobenate dimeglumine (MULTIHANCE) injection 20 mL     Objective: Blood pressure 114/77, pulse 81, temperature 98.1 F (36.7 C), temperature  source Temporal, height 6' 2"  (1.88 m), weight 218 lb 12.8 oz (99.2 kg). Patient is alert and in no acute distress. He is wearing a facial mask. He has erythema to skin of forehead secondary to therapy for skin lesions recently. Conjunctiva is pink. Sclera is nonicteric Oropharyngeal mucosa is normal. No neck masses or thyromegaly noted. Cardiac exam with regular rhythm normal S1 and S2. No murmur or gallop noted. Lungs are clear to auscultation. Abdomen is symmetrical.  Ileostomy is  located in right mid abdomen.  Ileostomy bag is small amount of brown soft pasty stool.  On palpation abdomen is soft and nontender with organomegaly or masses. Rectal examination was limited to external exam. Three of the fistulous openings have healed.  He has one on either side of anal orifice with granulation tissue but no drainage.  There are some induration around the one on the right side. No LE edema or clubbing noted.  Labs/studies Results:  CBC Latest Ref Rng & Units 01/21/2019 11/13/2018 08/19/2018  WBC 3.8 - 10.8 Thousand/uL 8.8 11.3(H) 8.3  Hemoglobin 13.2 - 17.1 g/dL 12.8(L) 13.5 14.1  Hematocrit 38.5 - 50.0 % 39.8 41.8 44.3  Platelets 140 - 400 Thousand/uL 525(H) 488(H) 366    CMP Latest Ref Rng & Units 01/21/2019 11/13/2018 08/19/2018  Glucose 65 - 139 mg/dL 92 72 102(H)  BUN 7 - 25 mg/dL 10 9 11   Creatinine 0.70 - 1.33 mg/dL 1.07 0.91 0.99  Sodium 135 - 146 mmol/L 137 138 136  Potassium 3.5 - 5.3 mmol/L 4.6 4.3 3.7  Chloride 98 - 110 mmol/L 101 103 104  CO2 20 - 32 mmol/L 25 28 26   Calcium 8.6 - 10.3 mg/dL 9.4 9.5 8.9  Total Protein 6.1 - 8.1 g/dL 7.9 7.7 8.4(H)  Total Bilirubin 0.2 - 1.2 mg/dL 0.7 0.8 1.5(H)  Alkaline Phos 38 - 126 U/L - - 83  AST 10 - 35 U/L 21 12 16   ALT 9 - 46 U/L 26 10 15     Hepatic Function Latest Ref Rng & Units 01/21/2019 11/13/2018 08/19/2018  Total Protein 6.1 - 8.1 g/dL 7.9 7.7 8.4(H)  Albumin 3.5 - 5.0 g/dL - - 3.8  AST 10 - 35 U/L 21 12 16   ALT 9 - 46 U/L 26 10 15   Alk Phosphatase 38 - 126 U/L - - 83  Total Bilirubin 0.2 - 1.2 mg/dL 0.7 0.8 1.5(H)  Bilirubin, Direct 0.0 - 0.2 mg/dL - - -    Lab Results  Component Value Date   CRP 29.9 (H) 11/13/2018      Assessment:  #1.  Refractory Crohn's disease involving colon as well as perianal disease with multiple fistulae.  He has failed to anti-TNF antibodies, 1 integrin antibody, interleukin antibody as well as 1 Jak inhibitor.  He is doing much better since his ileostomy and I  believe improvement is primarily due to fecal diversion but hopefully certolizumab pegol is also helping.  #2.  Iron deficiency anemia.   Plan:  Patient will continue certolizumab pegol 673 mg SQ every weeks after he completes induction next week. Patient will have CBC with differential, comprehensive chemistry panel, CRP and sed rate in 1 month. Office visit in 2 months.

## 2019-02-24 NOTE — Patient Instructions (Signed)
Next blood work in 1 month.

## 2019-02-26 ENCOUNTER — Other Ambulatory Visit (INDEPENDENT_AMBULATORY_CARE_PROVIDER_SITE_OTHER): Payer: Self-pay | Admitting: *Deleted

## 2019-02-26 DIAGNOSIS — D500 Iron deficiency anemia secondary to blood loss (chronic): Secondary | ICD-10-CM

## 2019-02-26 DIAGNOSIS — K50114 Crohn's disease of large intestine with abscess: Secondary | ICD-10-CM

## 2019-03-17 ENCOUNTER — Telehealth (INDEPENDENT_AMBULATORY_CARE_PROVIDER_SITE_OTHER): Payer: Self-pay | Admitting: Nurse Practitioner

## 2019-03-17 DIAGNOSIS — Z1211 Encounter for screening for malignant neoplasm of colon: Secondary | ICD-10-CM

## 2019-03-17 NOTE — Telephone Encounter (Signed)
Cory [name], Please call this patient and let him know that his stool test that he completed came back abnormal. For this reason I am going to refer him to GI for further evaluation. Please determine if he has a preference for a specific GI doctor and if he does please add this information to the referral. Thank you.

## 2019-03-17 NOTE — Telephone Encounter (Signed)
Call him and let him know that his stool test was abnormal and that he needs to see GI for further evaluation.  You can ask him at that time if he has a preference for which GI doctor we refer him to.  If he does not can add the preferred doctor to the referral.  Thank you.

## 2019-03-17 NOTE — Telephone Encounter (Signed)
Called Mr Teal and wanted to see what GI office he prefer to see?

## 2019-03-19 NOTE — Telephone Encounter (Signed)
Return call. Asked the patient to call me back to setup appt/referral for him.

## 2019-03-28 LAB — COMPREHENSIVE METABOLIC PANEL
AG Ratio: 1.2 (calc) (ref 1.0–2.5)
ALT: 23 U/L (ref 9–46)
AST: 22 U/L (ref 10–35)
Albumin: 4.3 g/dL (ref 3.6–5.1)
Alkaline phosphatase (APISO): 92 U/L (ref 35–144)
BUN: 10 mg/dL (ref 7–25)
CO2: 27 mmol/L (ref 20–32)
Calcium: 9.9 mg/dL (ref 8.6–10.3)
Chloride: 104 mmol/L (ref 98–110)
Creat: 1.01 mg/dL (ref 0.70–1.33)
Globulin: 3.7 g/dL (calc) (ref 1.9–3.7)
Glucose, Bld: 109 mg/dL (ref 65–139)
Potassium: 4.4 mmol/L (ref 3.5–5.3)
Sodium: 138 mmol/L (ref 135–146)
Total Bilirubin: 1 mg/dL (ref 0.2–1.2)
Total Protein: 8 g/dL (ref 6.1–8.1)

## 2019-03-28 LAB — CBC WITH DIFFERENTIAL/PLATELET
Absolute Monocytes: 599 cells/uL (ref 200–950)
Basophils Absolute: 40 cells/uL (ref 0–200)
Basophils Relative: 0.7 %
Eosinophils Absolute: 131 cells/uL (ref 15–500)
Eosinophils Relative: 2.3 %
HCT: 44.8 % (ref 38.5–50.0)
Hemoglobin: 14.9 g/dL (ref 13.2–17.1)
Lymphs Abs: 1163 cells/uL (ref 850–3900)
MCH: 27.1 pg (ref 27.0–33.0)
MCHC: 33.3 g/dL (ref 32.0–36.0)
MCV: 81.6 fL (ref 80.0–100.0)
MPV: 9.4 fL (ref 7.5–12.5)
Monocytes Relative: 10.5 %
Neutro Abs: 3768 cells/uL (ref 1500–7800)
Neutrophils Relative %: 66.1 %
Platelets: 271 10*3/uL (ref 140–400)
RBC: 5.49 10*6/uL (ref 4.20–5.80)
RDW: 16.8 % — ABNORMAL HIGH (ref 11.0–15.0)
Total Lymphocyte: 20.4 %
WBC: 5.7 10*3/uL (ref 3.8–10.8)

## 2019-03-28 LAB — SEDIMENTATION RATE: Sed Rate: 29 mm/h — ABNORMAL HIGH (ref 0–20)

## 2019-03-28 LAB — C-REACTIVE PROTEIN: CRP: 15.5 mg/L — ABNORMAL HIGH (ref <8.0)

## 2019-04-22 ENCOUNTER — Other Ambulatory Visit: Payer: Self-pay

## 2019-04-22 ENCOUNTER — Ambulatory Visit (INDEPENDENT_AMBULATORY_CARE_PROVIDER_SITE_OTHER): Payer: 59 | Admitting: Internal Medicine

## 2019-04-22 ENCOUNTER — Encounter (INDEPENDENT_AMBULATORY_CARE_PROVIDER_SITE_OTHER): Payer: Self-pay | Admitting: Internal Medicine

## 2019-04-22 VITALS — BP 115/75 | HR 76 | Temp 97.3°F | Ht 74.0 in | Wt 221.1 lb

## 2019-04-22 DIAGNOSIS — K50114 Crohn's disease of large intestine with abscess: Secondary | ICD-10-CM | POA: Diagnosis not present

## 2019-04-22 DIAGNOSIS — K50119 Crohn's disease of large intestine with unspecified complications: Secondary | ICD-10-CM

## 2019-04-22 NOTE — Patient Instructions (Signed)
Will confer with Dr. [name]. Further recommendations to follow.

## 2019-04-22 NOTE — Progress Notes (Signed)
Presenting complaint;  Follow-up for Crohn's disease.  Database and subjective:  Patient is 59 year old Caucasian male with Crohn's disease involving colon and perianal disease with multiple fistulae which has been refractory to multiple medications who underwent ileostomy and November 2020 and now has been on Cimzia for 4 months.  He was last seen 2 months ago.  He was starting to feel better.  He returns for scheduled visit accompanied by his wife [name].  Overall he feels better.  His strength and stamina has improved a great deal.  His appetite is good.  He is not having nausea vomiting fever chills or abdominal pain but now he is having mucus and bleeding per rectum.  He goes to the bathroom 6-8 times.  It is small in amount.  According his wife may have one episode where he noted significant amount of blood.  Most of the time but is on wiping.  He says 2 of the remaining fistulae have not healed.  Current Medications: Outpatient Encounter Medications as of 04/22/2019  Medication Sig  . Ascorbic Acid (VITAMIN C) 500 MG CHEW Chew by mouth daily.  . Certolizumab Pegol (CIMZIA ) Inject 400 mg into the skin every 30 (thirty) days. Inject 400 mg SQ Monthly  . cholecalciferol (VITAMIN D) 1000 units tablet Take 5,000 Units by mouth daily.   . Melatonin 3 MG CAPS Take by mouth at bedtime.  Marland Kitchen nystatin cream (MYCOSTATIN) Apply 1 application topically 2 (two) times daily.  Marland Kitchen nystatin-triamcinolone (MYCOLOG II) cream Apply 1 application topically 2 (two) times daily.  Marland Kitchen OVER THE COUNTER MEDICATION Adrenal Support -  Patient states that he takes one daily.  . Probiotic Product (PROBIOTIC DAILY PO) Take 1 capsule by mouth daily.  . rosuvastatin (CRESTOR) 10 MG tablet Take 10 mg by mouth daily.   Facility-Administered Encounter Medications as of 04/22/2019  Medication  . gadobenate dimeglumine (MULTIHANCE) injection 20 mL     Objective: Blood pressure 115/75, pulse 76, temperature (!) 97.3 F  (36.3 C), temperature source Temporal, height _0  (1.88 m), weight 221 lb 1.6 oz (100.3 kg). Patient is alert and in no acute distress. He is wearing a facial mask. Conjunctiva is pink. Sclera is nonicteric Oropharyngeal mucosa is normal. No neck masses or thyromegaly noted. Cardiac exam with regular rhythm normal S1 and S2. No murmur or gallop noted. Lungs are clear to auscultation. Abdomen is symmetrical.  He has ileostomy in right mid abdomen.  Ileostomy bag contains small amount of stool.  He did show me pictures of his ileostomy and mucosa appears to be healthy.  On palpation abdomen is soft and nontender without organomegaly or masses. Rectal examination was limited to external inspection.  There is mild induration involving perianal region on the left and the right side surrounding 2 of the fistula which are open.  There is no copious drainage. No LE edema or clubbing noted.  Labs/studies Results:  CBC Latest Ref Rng & Units 03/27/2019 01/21/2019 11/13/2018  WBC 3.8 - 10.8 Thousand/uL 5.7 8.8 11.3(H)  Hemoglobin 13.2 - 17.1 g/dL 14.9 12.8(L) 13.5  Hematocrit 38.5 - 50.0 % 44.8 39.8 41.8  Platelets 140 - 400 Thousand/uL 271 525(H) 488(H)    CMP Latest Ref Rng & Units 03/27/2019 01/21/2019 11/13/2018  Glucose 65 - 139 mg/dL 109 92 72  BUN 7 - 25 mg/dL _1 Creatinine 0.70 - 1.33 mg/dL 1.01 1.07 0.91  Sodium 135 - 146 mmol/L 138 137 138  Potassium 3.5 - 5.3 mmol/L 4.4  4.6 4.3  Chloride 98 - 110 mmol/L 104 101 103  CO2 20 - 32 mmol/L _0 Calcium 8.6 - 10.3 mg/dL 9.9 9.4 9.5  Total Protein 6.1 - 8.1 g/dL 8.0 7.9 7.7  Total Bilirubin 0.2 - 1.2 mg/dL 1.0 0.7 0.8  Alkaline Phos 38 - 126 U/L - - -  AST 10 - 35 U/L _1 ALT 9 - 46 U/L _2 Hepatic Function Latest Ref Rng & Units 03/27/2019 01/21/2019 11/13/2018  Total Protein 6.1 - 8.1 g/dL 8.0 7.9 7.7  Albumin 3.5 - 5.0 g/dL - - -  AST 10 - 35 U/L _3 ALT 9 - 46 U/L _4 Alk Phosphatase 38 - 126  U/L - - -  Total Bilirubin 0.2 - 1.2 mg/dL 1.0 0.7 0.8  Bilirubin, Direct 0.0 - 0.2 mg/dL - - -    Lab Results  Component Value Date   CRP 15.5 (H) 03/27/2019      Assessment:  #1.  Crohn's disease involving colon and perianal disease with multiple fistula difficult to treat.  He is status post ileostomy about 4 months ago when he is on Cimzia/Certolizumab.  He is not having rectal discharge and some bleeding.  Since this is a new symptom I am concerned that he may have developed diversion colitis.  As long as the symptom is mild may not consider any therapy.  The symptoms could also be due to inflammatory bowel disease. Overall quality of his life has improved significantly since he had ileostomy.  Inflammatory markers also show improvement.  Plan:  Patient will continue Cimzia/Certolizumab every 4 weeks. If rectal bleeding or drainage becomes copious would consider sigmoidoscopy. Will confer with Dr. Renee Harder of [facility] who has seen [place] in the past. Office visit in 3 months.

## 2019-05-14 ENCOUNTER — Telehealth (INDEPENDENT_AMBULATORY_CARE_PROVIDER_SITE_OTHER): Payer: Self-pay | Admitting: *Deleted

## 2019-05-14 NOTE — Telephone Encounter (Signed)
Per Dr.Rehman he had rec'd from the patient a message that he has a fever, and has had some open sores to come up on his back.  He ask that I call in Cipro 500 mg - patient will take 1 by mouth twice a day for 2 weeks, Flagyl 250 mg - patient will take by mouth three times daily for 2 weeks. [facility] [address] [facility] [place] a.  Patient is aware.

## 2019-06-10 ENCOUNTER — Telehealth (INDEPENDENT_AMBULATORY_CARE_PROVIDER_SITE_OTHER): Payer: Self-pay | Admitting: *Deleted

## 2019-06-10 NOTE — Telephone Encounter (Signed)
Per Dr.Rehman call the following : Cipro 250 mg - take 1 by mouth twice a day for 10 days #30 no refills. Flagyl 250 mg - take 1 by mouth three times a day #30 no refills.  [facility] [place]. Patient's wife is aware.

## 2019-06-23 ENCOUNTER — Telehealth (INDEPENDENT_AMBULATORY_CARE_PROVIDER_SITE_OTHER): Payer: Self-pay | Admitting: Internal Medicine

## 2019-06-23 NOTE — Telephone Encounter (Signed)
Talk with patient's wife [name]. Patient was seen by Dr. Cheryll Cockayne.  He had abdominal pelvic CT with it was an incomplete study because it did not go low enough to be able to see perineum. Cimzia is not working.  Patient just took her dose. Patient to continue metronidazole to 50 mg 3 times a day for 1 month and reduce her prior dose to 50 mg twice daily.  Patient needs office visit with me in 1 to 2 weeks.

## 2019-06-25 ENCOUNTER — Encounter (INDEPENDENT_AMBULATORY_CARE_PROVIDER_SITE_OTHER): Payer: Self-pay

## 2019-06-25 ENCOUNTER — Other Ambulatory Visit (INDEPENDENT_AMBULATORY_CARE_PROVIDER_SITE_OTHER): Payer: Self-pay | Admitting: Internal Medicine

## 2019-06-25 MED ORDER — METRONIDAZOLE 250 MG PO TABS: 250.0000 mg | ORAL_TABLET | Freq: Three times a day (TID) | ORAL | 1 refills | Status: DC

## 2019-06-25 MED ORDER — CIPROFLOXACIN HCL 500 MG PO TABS: 500.0000 mg | ORAL_TABLET | Freq: Two times a day (BID) | ORAL | 1 refills | Status: DC

## 2019-06-25 NOTE — Telephone Encounter (Signed)
Prescription for Cipro and metronidazole sent to patient's pharmacy  I reviewed CT report; study was done at [facility].  It did not extend all the way down to perianal region.  Study incomplete.  [name] told me Dr. Morton Stall was quite upset.

## 2019-07-15 ENCOUNTER — Ambulatory Visit (INDEPENDENT_AMBULATORY_CARE_PROVIDER_SITE_OTHER): Payer: 59 | Admitting: Internal Medicine

## 2019-07-15 ENCOUNTER — Encounter (INDEPENDENT_AMBULATORY_CARE_PROVIDER_SITE_OTHER): Payer: Self-pay | Admitting: Internal Medicine

## 2019-07-15 ENCOUNTER — Other Ambulatory Visit: Payer: Self-pay

## 2019-07-15 VITALS — BP 116/75 | HR 76 | Temp 97.8°F | Ht 74.0 in | Wt 225.0 lb

## 2019-07-15 DIAGNOSIS — K50114 Crohn's disease of large intestine with abscess: Secondary | ICD-10-CM

## 2019-07-15 MED ORDER — TOFACITINIB CITRATE 5 MG PO TABS: 5.0000 mg | ORAL_TABLET | Freq: Two times a day (BID) | ORAL | 5 refills | Status: DC

## 2019-07-15 NOTE — Progress Notes (Signed)
Presenting complaint;  Follow for Crohn's disease.  Database and subjective:  Patient is 59 year old Caucasian male with history of perianal fistulizing Crohn's disease along with colonic involvement who has failed multiple classes of medications.  He had ileostomy in November 2020 and has been on Cimzia/certolizumab for 8 to 66-month who has been having recurrent formation and drainage of perianal fistulae and has been on Cipro and metronidazole intermittently. [facility] and had abdominal pelvic CT which was not low enough to include perianal region and therefore not helpful. Patient's wife called few weeks ago stating that he was having increased drainage and some of the fistula had opened up and he was having low-grade temp.  Patient was advised to go back on Cipro and metronidazole.  He has 1 more days worth of medication left. He is accompanied by his wife RWells Guiles He feels much better.  He is not having any abdominal or perianal pain.  He has minimal drainage from perianal fistula.  He also has some rectal drainage small amount 4-6 times in a day.  It is yellow in color.  He does not have frank bleeding.  He notices blood on wiping no more than once a week.  His appetite is good.  He stays busy.  He is not doing wPsychologist, counselling  He has gained 4 pounds since his last visit. There has been no change in ileostomy output.  He has not noted bleeding or melena into the ileostomy bag. He is due for Cimzia dose tomorrow. His wife wonders if he could go back on [place] he took last year and noted significant improvement.  Current Medications: Outpatient Encounter Medications as of 07/15/2019  Medication Sig  . Ascorbic Acid (VITAMIN C) 500 MG CHEW Chew by mouth daily.  . Certolizumab Pegol (CIMZIA [place]) Inject 400 mg into the skin every 30 (thirty) days. Inject 400 mg SQ Monthly  . cholecalciferol (VITAMIN D) 1000 units tablet Take 5,000 Units by mouth  daily.   . ciprofloxacin (CIPRO) 500 MG tablet Take 1 tablet (500 mg total) by mouth 2 (two) times daily.  . metroNIDAZOLE (FLAGYL) 250 MG tablet Take 1 tablet (250 mg total) by mouth 3 (three) times daily.  .Marland Kitchennystatin cream (MYCOSTATIN) Apply 1 application topically 2 (two) times daily.  .Marland Kitchennystatin-triamcinolone (MYCOLOG II) cream Apply 1 application topically 2 (two) times daily.  . Probiotic Product (PROBIOTIC DAILY PO) Take 1 capsule by mouth daily.  . rosuvastatin (CRESTOR) 10 MG tablet Take 10 mg by mouth daily.  .Marland KitchenOVER THE COUNTER MEDICATION Adrenal Support -  Patient states that he takes one daily. (Patient not taking: Reported on 07/15/2019)  . [DISCONTINUED] Melatonin 3 MG CAPS Take by mouth at bedtime. (Patient not taking: Reported on 07/15/2019)   Facility-Administered Encounter Medications as of 07/15/2019  Medication  . gadobenate dimeglumine (MULTIHANCE) injection 20 mL     Objective: Blood pressure 116/75, pulse 76, temperature 97.8 F (36.6 C), temperature source Oral, height _0  (1.88 m), weight 225 lb (102.1 kg). Patient is alert and in no acute distress. He is wearing a mask. Conjunctiva is pink. Sclera is nonicteric Oropharyngeal mucosa is normal. No neck masses or thyromegaly noted. Cardiac exam with regular rhythm normal S1 and S2. No murmur or gallop noted. Lungs are clear to auscultation. Abdomen is symmetrical.  Ileostomy is located right mid abdomen.  Ileostomy bag contains greenish thick liquid stool.  Abdomen is soft and nontender with organomegaly.  He has parastomal hernia above ileostomy which is apparent when he stands up. No LE edema or clubbing noted.  Labs/studies Results:  CBC Latest Ref Rng & Units 03/27/2019 01/21/2019 11/13/2018  WBC 3.8 - 10.8 Thousand/uL 5.7 8.8 11.3(H)  Hemoglobin 13.2 - 17.1 g/dL 14.9 12.8(L) 13.5  Hematocrit 38 - 50 % 44.8 39.8 41.8  Platelets 140 - 400 Thousand/uL 271 525(H) 488(H)    CMP Latest Ref Rng & Units  03/27/2019 01/21/2019 11/13/2018  Glucose 65 - 139 mg/dL 109 92 72  BUN 7 - 25 mg/dL _0 Creatinine 0.70 - 1.33 mg/dL 1.01 1.07 0.91  Sodium 135 - 146 mmol/L 138 137 138  Potassium 3.5 - 5.3 mmol/L 4.4 4.6 4.3  Chloride 98 - 110 mmol/L 104 101 103  CO2 20 - 32 mmol/L _1 Calcium 8.6 - 10.3 mg/dL 9.9 9.4 9.5  Total Protein 6.1 - 8.1 g/dL 8.0 7.9 7.7  Total Bilirubin 0.2 - 1.2 mg/dL 1.0 0.7 0.8  Alkaline Phos 38 - 126 U/L - - -  AST 10 - 35 U/L _2 ALT 9 - 46 U/L _3 Hepatic Function Latest Ref Rng & Units 03/27/2019 01/21/2019 11/13/2018  Total Protein 6.1 - 8.1 g/dL 8.0 7.9 7.7  Albumin 3.5 - 5.0 g/dL - - -  AST 10 - 35 U/L _4 ALT 9 - 46 U/L _5 Alk Phosphatase 38 - 126 U/L - - -  Total Bilirubin 0.2 - 1.2 mg/dL 1.0 0.7 0.8  Bilirubin, Direct 0.0 - 0.2 mg/dL - - -    Lab Results  Component Value Date   CRP 15.5 (H) 03/27/2019      Assessment:  #1.  Perianal fistulizing Crohn's disease with colonic involvement which has been refractory to multiple modalities including mesalamine's, Biologics(infliximab, adalimumab and certolizumab), alpha4 B7 integrin antibody(vedolizumab), interleukin inhibitor(ustekinumab) and JAKinhibitor(tofacitinib).  He eventually went on to have ileostomy in November last year and perianal disease has not healed.  Few years ago he also had seton placement which provided temporary relief.  He has 2 small openings.  For other fistulae has healed.  He needs to be on antibiotic longer than 2 weeks. Cimzia is not working and therefore will be discontinued and he would be started back on Xeljanz/tofacitinib.  Plan:  Discontinue Cimzia. CBC, sed rate and CRP. Begin Xeljanz/tofacitinib 5 mg by mouth twice daily.  Patient given 86-monthsupply of samples and prescription sent to his pharmacy. Continue Cipro at 500 mg p.o. twice daily and metronidazole to 50 mg p.o. 3 times daily for another 2 weeks.  Need for continued  antibiotic therapy to be determined in 2 weeks. Office visit in 3 months.

## 2019-07-15 NOTE — Patient Instructions (Signed)
Physician will call with results of blood test.

## 2019-07-16 LAB — CBC WITH DIFFERENTIAL/PLATELET
Absolute Monocytes: 744 cells/uL (ref 200–950)
Basophils Absolute: 27 cells/uL (ref 0–200)
Basophils Relative: 0.4 %
Eosinophils Absolute: 181 cells/uL (ref 15–500)
Eosinophils Relative: 2.7 %
HCT: 45.7 % (ref 38.5–50.0)
Hemoglobin: 15.2 g/dL (ref 13.2–17.1)
Lymphs Abs: 1869 cells/uL (ref 850–3900)
MCH: 28.6 pg (ref 27.0–33.0)
MCHC: 33.3 g/dL (ref 32.0–36.0)
MCV: 86.1 fL (ref 80.0–100.0)
MPV: 9.5 fL (ref 7.5–12.5)
Monocytes Relative: 11.1 %
Neutro Abs: 3879 cells/uL (ref 1500–7800)
Neutrophils Relative %: 57.9 %
Platelets: 285 10*3/uL (ref 140–400)
RBC: 5.31 10*6/uL (ref 4.20–5.80)
RDW: 14.9 % (ref 11.0–15.0)
Total Lymphocyte: 27.9 %
WBC: 6.7 10*3/uL (ref 3.8–10.8)

## 2019-07-16 LAB — C-REACTIVE PROTEIN: CRP: 5.2 mg/L (ref <8.0)

## 2019-07-16 LAB — SEDIMENTATION RATE: Sed Rate: 22 mm/h — ABNORMAL HIGH (ref 0–20)

## 2019-07-17 ENCOUNTER — Ambulatory Visit (INDEPENDENT_AMBULATORY_CARE_PROVIDER_SITE_OTHER): Payer: 59 | Admitting: Internal Medicine

## 2019-07-24 ENCOUNTER — Encounter (INDEPENDENT_AMBULATORY_CARE_PROVIDER_SITE_OTHER): Payer: Self-pay

## 2019-07-25 ENCOUNTER — Other Ambulatory Visit (INDEPENDENT_AMBULATORY_CARE_PROVIDER_SITE_OTHER): Payer: Self-pay | Admitting: Internal Medicine

## 2019-07-25 MED ORDER — MESALAMINE 1000 MG RE SUPP: 1000.0000 mg | Freq: Every day | RECTAL | 5 refills | Status: DC

## 2019-08-01 ENCOUNTER — Other Ambulatory Visit (INDEPENDENT_AMBULATORY_CARE_PROVIDER_SITE_OTHER): Payer: Self-pay | Admitting: Internal Medicine

## 2019-08-01 MED ORDER — METRONIDAZOLE 250 MG PO TABS: 250.0000 mg | ORAL_TABLET | Freq: Three times a day (TID) | ORAL | 0 refills | Status: DC

## 2019-08-01 NOTE — Progress Notes (Signed)
Prescription for metronidazole 250 mg 3 times a day for 30 days sent to patient's pharmacy.

## 2019-08-05 ENCOUNTER — Encounter (INDEPENDENT_AMBULATORY_CARE_PROVIDER_SITE_OTHER): Payer: Self-pay | Admitting: *Deleted

## 2019-08-31 ENCOUNTER — Encounter (INDEPENDENT_AMBULATORY_CARE_PROVIDER_SITE_OTHER): Payer: Self-pay

## 2019-09-01 ENCOUNTER — Telehealth (INDEPENDENT_AMBULATORY_CARE_PROVIDER_SITE_OTHER): Payer: Self-pay | Admitting: *Deleted

## 2019-09-01 NOTE — Telephone Encounter (Signed)
To be reviewed with Dr.Rehman:  .Cory [name], Dr. Laural Golden told me let him know when I was out of Flagyl, I will run out this Thursday the 5th. I think he is planning on starting me on Doxycycline next. Things are better since we started 29m of Zeljanz along with flagyl and the Meslamine suppository. So far I've been on the suppositories for about a month. Thank you fall all that y'all do for me.   Patient will be made aware as soon as reviewed by Dr.Rehman.

## 2019-09-02 ENCOUNTER — Other Ambulatory Visit (INDEPENDENT_AMBULATORY_CARE_PROVIDER_SITE_OTHER): Payer: Self-pay | Admitting: *Deleted

## 2019-09-02 ENCOUNTER — Encounter (INDEPENDENT_AMBULATORY_CARE_PROVIDER_SITE_OTHER): Payer: Self-pay | Admitting: *Deleted

## 2019-09-02 DIAGNOSIS — K50114 Crohn's disease of large intestine with abscess: Secondary | ICD-10-CM

## 2019-09-02 MED ORDER — DOXYCYCLINE HYCLATE 100 MG PO CAPS: 100.0000 mg | ORAL_CAPSULE | Freq: Two times a day (BID) | ORAL | 0 refills | Status: DC

## 2019-09-02 NOTE — Telephone Encounter (Signed)
Dr.Rehman approved a call in for the medication Doxycycline 100 mg - take 1 by mouth twice a day. #60 , no refills. [facility] [place] Patient was made aware. He will need to call with a progress report as to how this medication is working prior to further refills.

## 2019-09-25 ENCOUNTER — Other Ambulatory Visit (INDEPENDENT_AMBULATORY_CARE_PROVIDER_SITE_OTHER): Payer: Self-pay | Admitting: Internal Medicine

## 2019-09-25 DIAGNOSIS — K50114 Crohn's disease of large intestine with abscess: Secondary | ICD-10-CM

## 2019-09-25 NOTE — Telephone Encounter (Signed)
Case discussed w/ Dr Laural Golden. Will refill x 1 month.

## 2019-10-01 ENCOUNTER — Encounter (INDEPENDENT_AMBULATORY_CARE_PROVIDER_SITE_OTHER): Payer: Self-pay

## 2019-10-07 ENCOUNTER — Encounter (INDEPENDENT_AMBULATORY_CARE_PROVIDER_SITE_OTHER): Payer: Self-pay | Admitting: Internal Medicine

## 2019-10-07 NOTE — Progress Notes (Signed)
To whom it may concern  Plan participants name: [name]  Plan participant ID:  [id number]  I am writing this letter to request that the Xeljanz/Tofacitinib be approved for Cory [name] who has colonic Crohn's disease with multiple perianal fistulae.  Cory [name] is only approved for ulcerative colitis but not Crohn's disease.  However it does work in patients with Crohn's disease. Unfortunately patient has very few options left.  He has been on Xeljanz/Tofacitinib previously and it did seem to help. Please note that patient has been treated with the following Biologics which provided short-term benefit or none at all.  He has been on Remicade/infliximab, Humira/adalimumab, and Entyvio/vedolizumab, Stelara/Ustekinumab and finally Cimzia/Certolizumab. In November 2020 he underwent diverting ileostomy at [facility] in [place] but perianal disease has not healed. He is intolerant of 6-mercaptopurine and has been on mesalamine and various antibiotics intermittently.  Prior to that he also underwent seton placement by Dr. Cheryll Cockayne of [facility] without complete healing. I believe if Cory [name] works it may obviate the need for proctocolectomy and permanent ileostomy. Given Mr. Canelo aggressive disease and limited options I would hope that Cory [name] would be approved for off label use in this difficult situation. Please call me if you have any questions or concerns. Thank you for your help.  Sincerely,  Cory Houston, MD  [facility].   [address] [place], [place] [address]

## 2019-10-16 ENCOUNTER — Other Ambulatory Visit: Payer: Self-pay | Admitting: Oncology

## 2019-10-16 ENCOUNTER — Encounter: Payer: Self-pay | Admitting: Oncology

## 2019-10-16 ENCOUNTER — Ambulatory Visit (HOSPITAL_COMMUNITY)
Admission: RE | Admit: 2019-10-16 | Discharge: 2019-10-16 | Disposition: A | Discharge status: Home / Self Care | Payer: 59 | Source: Ambulatory Visit | Attending: Pulmonary Disease | Admitting: Pulmonary Disease

## 2019-10-16 DIAGNOSIS — U071 COVID-19: Secondary | ICD-10-CM | POA: Diagnosis not present

## 2019-10-16 MED ORDER — ALBUTEROL SULFATE HFA 108 (90 BASE) MCG/ACT IN AERS: 2.0000 | INHALATION_SPRAY | Freq: Once | RESPIRATORY_TRACT | Status: DC | PRN

## 2019-10-16 MED ORDER — EPINEPHRINE 0.3 MG/0.3ML IJ SOAJ: 0.3000 mg | Freq: Once | INTRAMUSCULAR | Status: DC | PRN

## 2019-10-16 MED ORDER — SODIUM CHLORIDE 0.9 % IV SOLN
1200.0000 mg | Freq: Once | INTRAVENOUS | Status: AC
  Administered 2019-10-16: 1200 mg via INTRAVENOUS

## 2019-10-16 MED ORDER — FAMOTIDINE IN NACL 20-0.9 MG/50ML-% IV SOLN: 20.0000 mg | Freq: Once | INTRAVENOUS | Status: DC | PRN

## 2019-10-16 MED ORDER — METHYLPREDNISOLONE SODIUM SUCC 125 MG IJ SOLR: 125.0000 mg | Freq: Once | INTRAMUSCULAR | Status: DC | PRN

## 2019-10-16 MED ORDER — DIPHENHYDRAMINE HCL 50 MG/ML IJ SOLN: 50.0000 mg | Freq: Once | INTRAMUSCULAR | Status: DC | PRN

## 2019-10-16 MED ORDER — SODIUM CHLORIDE 0.9 % IV SOLN: INTRAVENOUS | Status: DC | PRN

## 2019-10-16 NOTE — Progress Notes (Signed)
I connected by phone with  Cory [name] to discuss the potential use of an new treatment for mild to moderate COVID-19 viral infection in non-hospitalized patients.   This patient is a age/sex that meets the FDA criteria for Emergency Use Authorization of casirivimab\imdevimab.  Has a (+) direct SARS-CoV-2 viral test result 1. Has mild or moderate COVID-19  2. Is ? 59 years of age and weighs ? 40 kg 3. Is NOT hospitalized due to COVID-19 4. Is NOT requiring oxygen therapy or requiring an increase in baseline oxygen flow rate due to COVID-19 5. Is within 10 days of symptom onset 6. Has at least one of the high risk factor(s) for progression to severe COVID-19 and/or hospitalization as defined in EUA. Specific high risk criteria : Past Medical History:  Diagnosis Date  . Crohn's disease ([place])   . History of degenerative disc disease   ?  ?    Symptom onset  10/08/2019   I have spoken and communicated the following to the patient or parent/caregiver:   1. FDA has authorized the emergency use of casirivimab\imdevimab for the treatment of mild to moderate COVID-19 in adults and pediatric patients with positive results of direct SARS-CoV-2 viral testing who are 74 years of age and older weighing at least 40 kg, and who are at high risk for progressing to severe COVID-19 and/or hospitalization.   2. The significant known and potential risks and benefits of casirivimab\imdevimab, and the extent to which such potential risks and benefits are unknown.   3. Information on available alternative treatments and the risks and benefits of those alternatives, including clinical trials.   4. Patients treated with casirivimab\imdevimab should continue to self-isolate and use infection control measures (e.g., wear mask, isolate, social distance, avoid sharing personal items, clean and disinfect "high touch" surfaces, and frequent handwashing) according to CDC guidelines.    5. The patient or parent/caregiver  has the option to accept or refuse casirivimab\imdevimab .   After reviewing this information with the patient, The patient agreed to proceed with receiving casirivimab\imdevimab infusion and will be provided a copy of the Fact sheet prior to receiving the infusion.Rulon Abide, AGNP-C [phone] ([place])

## 2019-10-16 NOTE — Discharge Instructions (Signed)

## 2019-10-16 NOTE — Progress Notes (Signed)
  Diagnosis: COVID-19  Physician:Dr wright  Procedure: [facility] Med: casirivimab\imdevimab infusion - Provided patient with casirivimab\imdevimab fact sheet for patients, parents and caregivers prior to infusion.  Complications: No immediate complications noted.  Discharge: Discharged home   [place], [place] 10/16/2019

## 2019-10-28 ENCOUNTER — Ambulatory Visit (INDEPENDENT_AMBULATORY_CARE_PROVIDER_SITE_OTHER): Payer: 59 | Admitting: Internal Medicine

## 2019-10-28 ENCOUNTER — Encounter (INDEPENDENT_AMBULATORY_CARE_PROVIDER_SITE_OTHER): Payer: Self-pay | Admitting: Internal Medicine

## 2019-10-28 ENCOUNTER — Other Ambulatory Visit: Payer: Self-pay

## 2019-10-28 VITALS — BP 120/74 | HR 73 | Temp 97.9°F | Ht 74.0 in | Wt 230.9 lb

## 2019-10-28 DIAGNOSIS — K50114 Crohn's disease of large intestine with abscess: Secondary | ICD-10-CM

## 2019-10-28 MED ORDER — TOFACITINIB CITRATE 5 MG PO TABS
5.0000 mg | ORAL_TABLET | Freq: Three times a day (TID) | ORAL | 5 refills | Status: DC
Start: 2019-10-28 — End: 2020-10-21

## 2019-10-28 MED ORDER — METRONIDAZOLE 250 MG PO TABS: 250.0000 mg | ORAL_TABLET | Freq: Three times a day (TID) | ORAL | 0 refills | Status: DC

## 2019-10-28 NOTE — Progress Notes (Signed)
Presenting complaint;  Follow-up for fistulizing Crohn's disease.  Database and subjective:  Cory [name] is 59 year old Caucasian male who has Crohn's disease involving colon and perianal disease with multiple fistula which has been refractory to therapy who underwent colostomy in November last year.  He has tried various Biologics.  Last one was Cimzia/certolizumab pegol which was discontinued after 6 months because it was not working.  Patient is now on Xeljanz/tofacitinib. He has been on one of the other antibiotic for several weeks. He was last seen 3 months ago. He is accompanied by his wife Cory Guiles.  Patient states he is feeling better.  Couple of weeks ago his wife had Covid infection and received antibiotics.  Patient tested negative but he had typical symptoms lasting for couple of days but he did not do a follow-up testing. He reports decrease in drainage from perianal fistulae.  He states he did the best while he was on metronidazole and he went to 1 month without any drainage.  He was using mesalamine suppositories but noticed some discomfort and stopped using them.  His appetite is good.  Now is having maybe 3 bowel movements and he just passes small amount of mucus.  His appetite is good.  He has gained 5 pounds since his last visit and his goal is to try to lose some. He is not having any side effects with Morrie Sheldon.  His wife wonders if he could increase the dose.  This medication has not been improved by his insurance yet.  We have been able to provide him with samples.   Current Medications: Outpatient Encounter Medications as of 10/28/2019  Medication Sig  . Ascorbic Acid (VITAMIN C) 500 MG CHEW Chew by mouth daily.  . cholecalciferol (VITAMIN D) 1000 units tablet Take 5,000 Units by mouth daily.   Marland Kitchen doxycycline (VIBRAMYCIN) 100 MG capsule TAKE 1 CAPSULE BY MOUTH TWICE A DAY  . nystatin cream (MYCOSTATIN) Apply 1 application topically 2 (two) times daily.  Marland Kitchen nystatin-triamcinolone  (MYCOLOG II) cream Apply 1 application topically 2 (two) times daily.  . rosuvastatin (CRESTOR) 10 MG tablet Take 10 mg by mouth daily.  . mesalamine ([place]) 1000 MG suppository Place 1 suppository (1,000 mg total) rectally at bedtime. (Patient not taking: Reported on 10/28/2019)  . Probiotic Product (PROBIOTIC DAILY PO) Take 1 capsule by mouth daily. (Patient not taking: Reported on 10/28/2019)  . Tofacitinib Citrate 5 MG TABS Take 1 tablet (5 mg total) by mouth 2 (two) times daily.  . [DISCONTINUED] ciprofloxacin (CIPRO) 500 MG tablet Take 1 tablet (500 mg total) by mouth 2 (two) times daily. (Patient not taking: Reported on 10/28/2019)  . [DISCONTINUED] metroNIDAZOLE (FLAGYL) 250 MG tablet TAKE 1 TABLET BY MOUTH 3 TIMES A DAY (Patient not taking: Reported on 10/28/2019)   Facility-Administered Encounter Medications as of 10/28/2019  Medication  . gadobenate dimeglumine (MULTIHANCE) injection 20 mL     Objective: Blood pressure 120/74, pulse 73, temperature 97.9 F (36.6 C), temperature source Oral, height 6' 2"  (1.88 m), weight 230 lb 14.4 oz (104.7 kg). Patient is alert and in no acute distress. He is wearing a mask. Conjunctiva is pink. Sclera is nonicteric Oropharyngeal mucosa is normal. No neck masses or thyromegaly noted. Cardiac exam with regular rhythm normal S1 and S2. No murmur or gallop noted. Lungs are clear to auscultation. Abdomen is symmetrical soft and nontender with no organomegaly or masses. Rectal examination is limited to external inspection.  He has 2 openings 1 at 3:00 another 1 is at  9.  Perianal skin to the right of orifice is not indurated.  Skin on the right side is indurated.  There is no drainage from these fistulae. No LE edema or clubbing noted.  Labs/studies Results:   CBC Latest Ref Rng & Units 07/15/2019 03/27/2019 01/21/2019  WBC 3.8 - 10.8 Thousand/uL 6.7 5.7 8.8  Hemoglobin 13.2 - 17.1 g/dL 15.2 14.9 12.8(L)  Hematocrit 38 - 50 % 45.7 44.8 39.8   Platelets 140 - 400 Thousand/uL 285 271 525(H)    CMP Latest Ref Rng & Units 03/27/2019 01/21/2019 11/13/2018  Glucose 65 - 139 mg/dL 109 92 72  BUN 7 - 25 mg/dL 10 10 9   Creatinine 0.70 - 1.33 mg/dL 1.01 1.07 0.91  Sodium 135 - 146 mmol/L 138 137 138  Potassium 3.5 - 5.3 mmol/L 4.4 4.6 4.3  Chloride 98 - 110 mmol/L 104 101 103  CO2 20 - 32 mmol/L 27 25 28   Calcium 8.6 - 10.3 mg/dL 9.9 9.4 9.5  Total Protein 6.1 - 8.1 g/dL 8.0 7.9 7.7  Total Bilirubin 0.2 - 1.2 mg/dL 1.0 0.7 0.8  Alkaline Phos 38 - 126 U/L - - -  AST 10 - 35 U/L 22 21 12   ALT 9 - 46 U/L 23 26 10     Hepatic Function Latest Ref Rng & Units 03/27/2019 01/21/2019 11/13/2018  Total Protein 6.1 - 8.1 g/dL 8.0 7.9 7.7  Albumin 3.5 - 5.0 g/dL - - -  AST 10 - 35 U/L 22 21 12   ALT 9 - 46 U/L 23 26 10   Alk Phosphatase 38 - 126 U/L - - -  Total Bilirubin 0.2 - 1.2 mg/dL 1.0 0.7 0.8  Bilirubin, Direct 0.0 - 0.2 mg/dL - - -    Lab Results  Component Value Date   CRP 5.2 07/15/2019      Assessment:  #1.  Colonic and perianal fistulizing Crohn's disease which has been refractory to therapy.  He has been tried on multiple agents and it appears Xeljanz/tofacitinib is helping along with an antibiotic.  Topical mesalamine may have helped as well.  #2.  Patient has ileostomy which is functioning well.  Goal is for him to eventually have it reversed.  Plan:  Patient will go to the lab for CBC, CMP, sed rate and CRP. Go back on metronidazole to 50 mg p.o. 3 times daily.  Prescription given for 1 month. Patient can try using mesalamine suppository 2-3 times a week if possible. Increase Xeljanz to 15 mg/day.  He can take 10 mg in the morning and 5 mg in the evening. Office visit in 3 months.

## 2019-10-28 NOTE — Patient Instructions (Signed)
Xeljanz 10 mg every morning and 5 mg in the evening.\ Physician will call results of blood test when completed.

## 2019-10-29 LAB — CBC
HCT: 46.1 % (ref 38.5–50.0)
Hemoglobin: 14.9 g/dL (ref 13.2–17.1)
MCH: 28.1 pg (ref 27.0–33.0)
MCHC: 32.3 g/dL (ref 32.0–36.0)
MCV: 87 fL (ref 80.0–100.0)
MPV: 9.7 fL (ref 7.5–12.5)
Platelets: 400 10*3/uL (ref 140–400)
RBC: 5.3 10*6/uL (ref 4.20–5.80)
RDW: 13.3 % (ref 11.0–15.0)
WBC: 7.3 10*3/uL (ref 3.8–10.8)

## 2019-10-29 LAB — SEDIMENTATION RATE: Sed Rate: 31 mm/h — ABNORMAL HIGH (ref 0–20)

## 2019-10-29 LAB — C-REACTIVE PROTEIN: CRP: 4.1 mg/L (ref <8.0)

## 2019-11-06 ENCOUNTER — Encounter (INDEPENDENT_AMBULATORY_CARE_PROVIDER_SITE_OTHER): Payer: Self-pay | Admitting: Internal Medicine

## 2019-11-06 NOTE — Progress Notes (Signed)
                              To whom it may concern   I am writing this letter to request that off label use for Xeljanz/Tofacitinib be improved for  Mr. Cory [name]'s, DOB January 16, 1961. He has been struggling with inflammatory bowel disease since 2004 when he was thought to have ulcerative colitis.  Evaluation at [facility] in 2010 revealed Crohn's colitis. His disease has been very difficult to manage.  Has been under my care since January 2017. He has been on multiple therapies over the last few years.  These include Remicade/infliximab, Humira/adalimumab, Stelara/Ustekinumab, severe/vedolizumab last year he was also tried on Cimzia/Cerolizuzimab following his visit with Dr. Regino Bellow, IBD expert at [facility] in [place]. Was also tried on azathioprine but developed arthralgias. He had seton placed by Dr. Cheryll Cockayne also of [facility] but his fistulae did not heal completely. In November, 2020 he had loop ileostomy but perianal disease has not healed completely despite him being on a biologic.  To summarize he could not tolerate immunomodulator, he has been on 3 different anti-TNF agents, interleukin monoclonal antibody as well as alpha 4 beta7 integrin antibody not to mention steroids and mesalamine's. Since he has been on Xeljanz/tofacitinib 5 of the 6 perianal fistulae have healed.  He is also being treated with antibiotics intermittently.  Symptomatically he is feeling much better.  His anemia has resolved and CRP is down to normal.  His sed rate also has improved.  It is very obvious that Mr. Cory [name] has very aggressive Crohn's disease and he seems to be responding to Xeljanz/tofacitinib.  Luckily patient is tolerating this medication well.  Fully aware that this is off label use of this medication as it was only approved for ulcerative colitis but not yet for Crohn's disease.  However studies have  shown it to be affected in patients with Crohn's disease as well. I am hoping with continued therapy he will go into remission and not end up losing colon and rectum. Would be happy to talk with your expert on inflammatory bowel disease. Thank you for your help.  Truly,  Hildred Laser, MD Gastroenterologist. [facility].

## 2019-11-13 ENCOUNTER — Encounter (INDEPENDENT_AMBULATORY_CARE_PROVIDER_SITE_OTHER): Payer: Self-pay | Admitting: *Deleted

## 2019-11-26 ENCOUNTER — Encounter (INDEPENDENT_AMBULATORY_CARE_PROVIDER_SITE_OTHER): Payer: Self-pay | Admitting: *Deleted

## 2019-11-30 ENCOUNTER — Other Ambulatory Visit (INDEPENDENT_AMBULATORY_CARE_PROVIDER_SITE_OTHER): Payer: Self-pay | Admitting: Internal Medicine

## 2019-11-30 ENCOUNTER — Encounter (INDEPENDENT_AMBULATORY_CARE_PROVIDER_SITE_OTHER): Payer: Self-pay | Admitting: Internal Medicine

## 2019-11-30 MED ORDER — AMOXICILLIN-POT CLAVULANATE 875-125 MG PO TABS: 1.0000 | ORAL_TABLET | Freq: Two times a day (BID) | ORAL | 0 refills | Status: DC

## 2019-11-30 NOTE — Progress Notes (Signed)
Patient's wife [name] called and he was having perineal pain.  Fever this morning.  He rode his bike yesterday.  He has developed 3 perianal areas with induration and one is nondraining.  He rode bike yesterday. He is on low-dose metronidazole. Will discontinue metronidazole and begin Augmentin 875 mg twice daily for 2 weeks.

## 2019-12-01 ENCOUNTER — Telehealth (INDEPENDENT_AMBULATORY_CARE_PROVIDER_SITE_OTHER): Payer: Self-pay | Admitting: *Deleted

## 2019-12-01 NOTE — Telephone Encounter (Signed)
On 11-30-19 rec'd a message from the patient's wife. Patient was febrile 101.8 , left side his three fistulas are closed but very painful and indurated for the last 5 days. BP was low 95/70 heart rate 110. He has had sitz baths and got a little relief on Saturday.  He was given Tylenol , fluids after breakfast BP was 111/69.  Questioned if the patient should start antibiotics or go to ED/Urgent Care.  Dr.Rehman was made aware, he called the patient wife. Antibiotics were called in and the patient was advised to follow up with [place] with 24 hours. Cory [name] currently has 2 doses of the antibiotic on board.  Dr. Laural Golden ask that the patient be called this morning for a progress report and to advise the patient to stop the Morrie Sheldon,  This morning the patient was called. He had no fever. Feels a little better this morning. Vitals are as follows per patient. BP (R) 148/91, (L) 133/80, HR 104 He states that the Sitz bathes did help and one of the fistulas they were able to drain. This made him fel better. Patient was advised that Dr.Rehman ask that he stop the [place].  Dr.Rehman will be made aware any further recommendations , the patient will be notified.  Marland Kitchen

## 2020-01-01 ENCOUNTER — Encounter (INDEPENDENT_AMBULATORY_CARE_PROVIDER_SITE_OTHER): Payer: Self-pay | Admitting: *Deleted

## 2020-01-16 ENCOUNTER — Telehealth (INDEPENDENT_AMBULATORY_CARE_PROVIDER_SITE_OTHER): Payer: Self-pay | Admitting: *Deleted

## 2020-01-16 ENCOUNTER — Other Ambulatory Visit (INDEPENDENT_AMBULATORY_CARE_PROVIDER_SITE_OTHER): Payer: Self-pay | Admitting: *Deleted

## 2020-01-16 DIAGNOSIS — K603 Anal fistula: Secondary | ICD-10-CM

## 2020-01-16 DIAGNOSIS — K50114 Crohn's disease of large intestine with abscess: Secondary | ICD-10-CM

## 2020-01-16 MED ORDER — METRONIDAZOLE 250 MG PO TABS: 250.0000 mg | ORAL_TABLET | Freq: Three times a day (TID) | ORAL | 1 refills | Status: DC

## 2020-01-16 MED ORDER — CIPROFLOXACIN HCL 500 MG PO TABS: 500.0000 mg | ORAL_TABLET | Freq: Two times a day (BID) | ORAL | 1 refills | Status: DC

## 2020-01-16 NOTE — Telephone Encounter (Signed)
[name] patients wife contacted the office to let us know that the patient is having pain and it is getting very similar to the way it was last time that he was almost septic. She feels that he may need to be on antibiotics. She shares that healed on the outside, they are hard indurated. Patient is doing sitz baths to try to get them to open.  This was shared with Dr.Rehman and he advised that Cipro 500 mg take 1 by mouth twice a day for 2 weeks #60 , and Flagyl 250 mg take 1 by mouth 3 times a day for 2 weeks #42. Be called in for the patient. He also suggest that patient contact Dr.Waters office to let him know. Dr. Laural Golden thinks that patient needs exam under anesthesia and seton placement. There is concern patient may become resistant to antibiotics.  Antibiotics were e scribed to [facility] in [place], [place].  Patient's wife was called and given Dr.Rehman's recommendations.

## 2020-01-19 ENCOUNTER — Other Ambulatory Visit (INDEPENDENT_AMBULATORY_CARE_PROVIDER_SITE_OTHER): Payer: Self-pay | Admitting: Internal Medicine

## 2020-01-30 ENCOUNTER — Encounter (INDEPENDENT_AMBULATORY_CARE_PROVIDER_SITE_OTHER): Payer: Self-pay

## 2020-02-03 ENCOUNTER — Encounter (INDEPENDENT_AMBULATORY_CARE_PROVIDER_SITE_OTHER): Payer: Self-pay | Admitting: Internal Medicine

## 2020-02-03 ENCOUNTER — Other Ambulatory Visit: Payer: Self-pay

## 2020-02-03 ENCOUNTER — Ambulatory Visit (INDEPENDENT_AMBULATORY_CARE_PROVIDER_SITE_OTHER): Payer: 59 | Admitting: Internal Medicine

## 2020-02-03 VITALS — BP 126/82 | HR 85 | Temp 98.0°F | Ht 74.0 in | Wt 236.5 lb

## 2020-02-03 DIAGNOSIS — K50119 Crohn's disease of large intestine with unspecified complications: Secondary | ICD-10-CM

## 2020-02-03 DIAGNOSIS — K50114 Crohn's disease of large intestine with abscess: Secondary | ICD-10-CM

## 2020-02-03 NOTE — Progress Notes (Signed)
Presenting complaint;  Follow-up for Crohn's disease.  Database and subjective:  Cory [name] is 60 year old Caucasian male with with over 15-year history of Crohn's disease involving colon and rectum with perianal fistulae who is here for scheduled visit accompanied by his wife. He has failed multiple medications and he has been on Xeljanz/tofacitinib until 1 week ago.  This medication was denied by his insurance company.  He had been using samples until then. Since his last visit of 10/28/2019 he has required antibiotic therapy for perianal abscesses.  He developed an abscess about 2 weeks ago when his wife [name] who is an Therapist, sports had lancet open with drainage of copious purulent material and immediate symptomatic relief.  He has finished antibiotics as well.  She has saved pictures on her smart phone that she would share with Dr. Cheryll Cockayne tomorrow. Patient states he has not had any fistulae or abscesses on the right side.  Lately they have always been on the left side.  He presently does not have any drainage.  He has mild soreness on the left side.  Both of these fistulae have closed.  He has clear rectal discharge no more than 3 times a day.  It is scant in amount.  He denies rectal bleeding.  He is not having any issues with his ileostomy.  He has developed parastomal hernia which is more pronounced when he standing up. His appetite is good.  He has gained 6 pounds since his last visit.  He remains active.  He is using Canasa suppository 2-3 times a week.  Current Medications: Outpatient Encounter Medications as of 02/03/2020  Medication Sig  . Ascorbic Acid (VITAMIN C) 500 MG CHEW Chew by mouth daily. Patient reports that he takes 2 per day.  . cholecalciferol (VITAMIN D) 1000 units tablet Take 5,000 Units by mouth daily.   . melatonin 3 MG TABS tablet Take 3 mg by mouth at bedtime.  . mesalamine (CANASA) 1000 MG suppository Place 1 suppository (1,000 mg total) rectally at bedtime.  Marland Kitchen nystatin  cream (MYCOSTATIN) Apply 1 application topically 2 (two) times daily.  Marland Kitchen nystatin-triamcinolone (MYCOLOG II) cream Apply 1 application topically 2 (two) times daily.  Marland Kitchen OVER THE COUNTER MEDICATION N-ACETYL CYSTEINE  a Supplement  - patient takes 1 by mouth in the morning.  . Probiotic Product (PROBIOTIC DAILY PO) Take 1 capsule by mouth daily.  . rosuvastatin (CRESTOR) 10 MG tablet Take 10 mg by mouth daily.  . Zinc 50 MG CAPS Take 50 mg by mouth daily.  Marland Kitchen amoxicillin-clavulanate (AUGMENTIN) 875-125 MG tablet Take 1 tablet by mouth 2 (two) times daily. (Patient not taking: Reported on 02/03/2020)  . ciprofloxacin (CIPRO) 500 MG tablet Take 1 tablet (500 mg total) by mouth 2 (two) times daily. (Patient not taking: Reported on 02/03/2020)  . metroNIDAZOLE (FLAGYL) 250 MG tablet Take 1 tablet (250 mg total) by mouth 3 (three) times daily. (Patient not taking: Reported on 02/03/2020)  . Tofacitinib Citrate 5 MG TABS Take 1 tablet (5 mg total) by mouth 3 (three) times daily with meals. (Patient not taking: Reported on 02/03/2020)   Facility-Administered Encounter Medications as of 02/03/2020  Medication  . gadobenate dimeglumine (MULTIHANCE) injection 20 mL     Objective: Blood pressure 126/82, pulse 85, temperature 98 F (36.7 C), temperature source Oral, height _0  (1.88 m), weight 236 lb 8 oz (107.3 kg). Patient is alert and in no acute distress. He is wearing a mask. Conjunctiva is pink. Sclera is nonicteric Oropharyngeal mucosa  is normal. No neck masses or thyromegaly noted. Cardiac exam with regular rhythm normal S1 and S2. No murmur or gallop noted. Lungs are clear to auscultation. Abdomen appears symmetrical in supine position.  Ileostomy is located right mid abdomen and ileostomy bag is empty.  Bag is contained inside binder.  On palpation abdomen is soft and nontender without organomegaly or masses. He has parastomal hernia which is apparent when he stands up. No LE edema or clubbing  noted.  Labs/studies Results:  CBC Latest Ref Rng & Units 10/28/2019 07/15/2019 03/27/2019  WBC 3.8 - 10.8 Thousand/uL 7.3 6.7 5.7  Hemoglobin 13.2 - 17.1 g/dL 14.9 15.2 14.9  Hematocrit 38.5 - 50.0 % 46.1 45.7 44.8  Platelets 140 - 400 Thousand/uL 400 285 271    CMP Latest Ref Rng & Units 03/27/2019 01/21/2019 11/13/2018  Glucose 65 - 139 mg/dL 109 92 72  BUN 7 - 25 mg/dL _0 Creatinine 0.70 - 1.33 mg/dL 1.01 1.07 0.91  Sodium 135 - 146 mmol/L 138 137 138  Potassium 3.5 - 5.3 mmol/L 4.4 4.6 4.3  Chloride 98 - 110 mmol/L 104 101 103  CO2 20 - 32 mmol/L _1 Calcium 8.6 - 10.3 mg/dL 9.9 9.4 9.5  Total Protein 6.1 - 8.1 g/dL 8.0 7.9 7.7  Total Bilirubin 0.2 - 1.2 mg/dL 1.0 0.7 0.8  Alkaline Phos 38 - 126 U/L - - -  AST 10 - 35 U/L _2 ALT 9 - 46 U/L _3 Hepatic Function Latest Ref Rng & Units 03/27/2019 01/21/2019 11/13/2018  Total Protein 6.1 - 8.1 g/dL 8.0 7.9 7.7  Albumin 3.5 - 5.0 g/dL - - -  AST 10 - 35 U/L _4 ALT 9 - 46 U/L _5 Alk Phosphatase 38 - 126 U/L - - -  Total Bilirubin 0.2 - 1.2 mg/dL 1.0 0.7 0.8  Bilirubin, Direct 0.0 - 0.2 mg/dL - - -    Lab Results  Component Value Date   CRP 4.1 10/28/2019       Sed rate was 31  (upper limit of normal 20)   Assessment:  #1.  Perianal fistulizing Crohn's disease with colonic involvement refractory to multiple agents(mesalamine, infliximab, adalimumab, vedolizumab, ustekinumab as well as Certolizumab and tofacitinib.  He finally had diverting loop ileostomy in November 2020.  He has continued to have perianal fistulae and abscesses.  He had seton placement as well few years ago.  All the disease activity is left to perianal opening.  I wonder if he would benefit from examination under anesthesia and seton placement.  He is to follow-up with Dr. Cheryll Cockayne of [facility] tomorrow. He is presently not on therapy for IBD.  Will explore other options with his insurance. Even though he continues to  have perianal disease requiring antibiotic therapy, his quality of life is improved greatly ill since he had diverting loop ileostomy.   Plan:  Patient will go to the lab for CBC, sed rate CRP and comprehensive chemistry panel. Will request peer to peer meeting with insurance carrier to find out if Tofacitinib could be approved or if there are other options. Office visit in 4 months.

## 2020-02-03 NOTE — Patient Instructions (Signed)
Physician will call with results of blood test when completed.

## 2020-03-05 LAB — COMPREHENSIVE METABOLIC PANEL
AG Ratio: 1.3 (calc) (ref 1.0–2.5)
ALT: 34 U/L (ref 9–46)
AST: 24 U/L (ref 10–35)
Albumin: 4.3 g/dL (ref 3.6–5.1)
Alkaline phosphatase (APISO): 107 U/L (ref 35–144)
BUN: 11 mg/dL (ref 7–25)
CO2: 27 mmol/L (ref 20–32)
Calcium: 9.8 mg/dL (ref 8.6–10.3)
Chloride: 104 mmol/L (ref 98–110)
Creat: 0.92 mg/dL (ref 0.70–1.33)
Globulin: 3.4 g/dL (calc) (ref 1.9–3.7)
Glucose, Bld: 90 mg/dL (ref 65–139)
Potassium: 4 mmol/L (ref 3.5–5.3)
Sodium: 139 mmol/L (ref 135–146)
Total Bilirubin: 1.2 mg/dL (ref 0.2–1.2)
Total Protein: 7.7 g/dL (ref 6.1–8.1)

## 2020-03-05 LAB — CBC WITH DIFFERENTIAL/PLATELET
Absolute Monocytes: 602 cells/uL (ref 200–950)
Basophils Absolute: 41 cells/uL (ref 0–200)
Basophils Relative: 0.8 %
Eosinophils Absolute: 122 cells/uL (ref 15–500)
Eosinophils Relative: 2.4 %
HCT: 43.7 % (ref 38.5–50.0)
Hemoglobin: 15 g/dL (ref 13.2–17.1)
Lymphs Abs: 1382 cells/uL (ref 850–3900)
MCH: 29.4 pg (ref 27.0–33.0)
MCHC: 34.3 g/dL (ref 32.0–36.0)
MCV: 85.7 fL (ref 80.0–100.0)
MPV: 9.7 fL (ref 7.5–12.5)
Monocytes Relative: 11.8 %
Neutro Abs: 2953 cells/uL (ref 1500–7800)
Neutrophils Relative %: 57.9 %
Platelets: 320 10*3/uL (ref 140–400)
RBC: 5.1 10*6/uL (ref 4.20–5.80)
RDW: 13.3 % (ref 11.0–15.0)
Total Lymphocyte: 27.1 %
WBC: 5.1 10*3/uL (ref 3.8–10.8)

## 2020-03-05 LAB — C-REACTIVE PROTEIN: CRP: 2.5 mg/L (ref <8.0)

## 2020-03-05 LAB — SEDIMENTATION RATE: Sed Rate: 29 mm/h — ABNORMAL HIGH (ref 0–20)

## 2020-04-08 ENCOUNTER — Other Ambulatory Visit (INDEPENDENT_AMBULATORY_CARE_PROVIDER_SITE_OTHER): Payer: Self-pay | Admitting: Internal Medicine

## 2020-04-08 MED ORDER — CIPROFLOXACIN HCL 500 MG PO TABS: 500.0000 mg | ORAL_TABLET | Freq: Two times a day (BID) | ORAL | 0 refills | Status: DC

## 2020-04-08 MED ORDER — METRONIDAZOLE 500 MG PO TABS: 500.0000 mg | ORAL_TABLET | Freq: Two times a day (BID) | ORAL | 0 refills | Status: DC

## 2020-05-12 ENCOUNTER — Other Ambulatory Visit (INDEPENDENT_AMBULATORY_CARE_PROVIDER_SITE_OTHER): Payer: Self-pay | Admitting: Internal Medicine

## 2020-05-12 MED ORDER — DOXYCYCLINE MONOHYDRATE 100 MG PO TABS: 100.0000 mg | ORAL_TABLET | Freq: Two times a day (BID) | ORAL | 0 refills | Status: DC

## 2020-05-12 NOTE — Progress Notes (Signed)
Patient's condition discussed with Dr. Renee Harder yesterday. He recommended using Skyrizi as soon as it is approved which would be within the next month or so. In the meantime we will treat him with doxycycline for recurrent perianal abscesses. Prescription sent to patient's pharmacy 400 mg twice daily.  When the drainage stops he will drop the dose to 100 mg daily.  I talk with patient last night.

## 2020-05-24 ENCOUNTER — Other Ambulatory Visit: Payer: Self-pay

## 2020-05-24 ENCOUNTER — Ambulatory Visit (INDEPENDENT_AMBULATORY_CARE_PROVIDER_SITE_OTHER): Payer: 59 | Admitting: Internal Medicine

## 2020-05-24 ENCOUNTER — Encounter (INDEPENDENT_AMBULATORY_CARE_PROVIDER_SITE_OTHER): Payer: Self-pay | Admitting: Internal Medicine

## 2020-05-24 VITALS — BP 106/70 | HR 71 | Temp 98.0°F | Ht 74.0 in | Wt 214.8 lb

## 2020-05-24 DIAGNOSIS — K50114 Crohn's disease of large intestine with abscess: Secondary | ICD-10-CM

## 2020-05-24 NOTE — Progress Notes (Incomplete)
Presenting complaint;  Follow-up of colonic and perianal fistulizing Crohn's disease.  Database and subjective:  Patient is 60 year old Caucasian male who has history of colonic and perianal fistulizing Crohn's disease which has been refractory to therapy.  He has failed all of biologic agents including tofacitinib. He had diverting ileostomy in November 2020 but perianal disease has not healed.  He had seton placement few years ago and again  Current Medications: Outpatient Encounter Medications as of 05/24/2020  Medication Sig  . Ascorbic Acid (VITAMIN C) 500 MG CHEW Chew by mouth daily. Patient reports that he takes 2 per day.  . cholecalciferol (VITAMIN D) 1000 units tablet Take 5,000 Units by mouth daily.   Marland Kitchen doxycycline (ADOXA) 100 MG tablet Take 1 tablet (100 mg total) by mouth 2 (two) times daily.  . melatonin 3 MG TABS tablet Take 3 mg by mouth at bedtime.  . mesalamine (CANASA) 1000 MG suppository PLACE 1 SUPPOSITORY (1,000 MG TOTAL) RECTALLY AT BEDTIME.  Marland Kitchen nystatin cream (MYCOSTATIN) Apply 1 application topically 2 (two) times daily.  Marland Kitchen nystatin-triamcinolone (MYCOLOG II) cream Apply 1 application topically 2 (two) times daily.  Marland Kitchen OVER THE COUNTER MEDICATION N-ACETYL CYSTEINE  a Supplement  - patient takes 1 by mouth in the morning.  . Probiotic Product (PROBIOTIC DAILY PO) Take 1 capsule by mouth daily.  . rosuvastatin (CRESTOR) 10 MG tablet Take 10 mg by mouth daily.  . Zinc 50 MG CAPS Take 50 mg by mouth daily.  . Tofacitinib Citrate 5 MG TABS Take 1 tablet (5 mg total) by mouth 3 (three) times daily with meals. (Patient not taking: Reported on 02/03/2020)   Facility-Administered Encounter Medications as of 05/24/2020  Medication  . gadobenate dimeglumine (MULTIHANCE) injection 20 mL   ***  Objective: Blood pressure 106/70, pulse 71, temperature 98 F (36.7 C), temperature source Oral, height 6' 2" (1.88 m), weight 214 lb 12.8 oz (97.4 kg). *** Conjunctiva is pink. Sclera  is nonicteric Oropharyngeal mucosa is normal. No neck masses or thyromegaly noted. Cardiac exam with regular rhythm normal S1 and S2. No murmur or gallop noted. Lungs are clear to auscultation. Abdomen;  No LE edema or clubbing noted.  Labs/studies Results:  CBC Latest Ref Rng & Units 03/04/2020 10/28/2019 07/15/2019  WBC 3.8 - 10.8 Thousand/uL 5.1 7.3 6.7  Hemoglobin 13.2 - 17.1 g/dL 15.0 14.9 15.2  Hematocrit 38.5 - 50.0 % 43.7 46.1 45.7  Platelets 140 - 400 Thousand/uL 320 400 285    CMP Latest Ref Rng & Units 03/04/2020 03/27/2019 01/21/2019  Glucose 65 - 139 mg/dL 90 109 92  BUN 7 - 25 mg/dL _0 Creatinine 0.70 - 1.33 mg/dL 0.92 1.01 1.07  Sodium 135 - 146 mmol/L 139 138 137  Potassium 3.5 - 5.3 mmol/L 4.0 4.4 4.6  Chloride 98 - 110 mmol/L 104 104 101  CO2 20 - 32 mmol/L _1 Calcium 8.6 - 10.3 mg/dL 9.8 9.9 9.4  Total Protein 6.1 - 8.1 g/dL 7.7 8.0 7.9  Total Bilirubin 0.2 - 1.2 mg/dL 1.2 1.0 0.7  Alkaline Phos 38 - 126 U/L - - -  AST 10 - 35 U/L _2 ALT 9 - 46 U/L 34 23 26    Hepatic Function Latest Ref Rng & Units 03/04/2020 03/27/2019 01/21/2019  Total Protein 6.1 - 8.1 g/dL 7.7 8.0 7.9  Albumin 3.5 - 5.0 g/dL - - -  AST 10 - 35 U/L _3 ALT 9 - 46  U/L 34 23 26  Alk Phosphatase 38 - 126 U/L - - -  Total Bilirubin 0.2 - 1.2 mg/dL 1.2 1.0 0.7  Bilirubin, Direct 0.0 - 0.2 mg/dL - - -    Lab Results  Component Value Date   CRP 2.5 03/04/2020      Assessment:  #1. #2. #3. #4.   Plan:  ***

## 2020-05-24 NOTE — Patient Instructions (Signed)
Physician will call with results of blood test when completed. Plan is to start using Colon as soon as it is FDA approved.

## 2020-05-24 NOTE — Progress Notes (Signed)
Presenting complaint;  Follow-up of colonic and perianal fistulizing Crohn's disease.  Database and subjective:  Patient is 60 year old Caucasian male who has history of colonic and perianal fistulizing Crohn's disease which has been refractory to therapy.  He has failed all of biologic agents including tofacitinib. He had diverting ileostomy in November 2020 but perianal disease has not healed.  He had exam under anesthesia and seton placement by Dr. Cheryll [name] of [facility] in April 2018 and again in January this year.  He remains with recurrent perianal abscesses. He has responded to a course of Cipro and metronidazole each time. He developed another perianal abscess and his wife Cory [name] who is an Therapist, sports lasted with immediate relief.  He was begun on doxycycline on 05/12/2020.  I also conferred with Dr. Casey [name] of [facility] and he recommended waiting until Skyrizi/risankizumab is approved which is expected within the next month or so. Cory [name] is not having any side effects with doxycycline.  Perianal drainage has stopped.  He has very mild discomfort when he is sitting in in chair.  He has daily urge and a bowel movement and he passes a tablespoon full of mucus and at times is blood-tinged.  He denies abdominal pain nausea or vomiting.  He reports no change in ileostomy output.  He has not had bleeding or melena and ileostomy.  He feels his quality of life is about 80%. His wife tells me that on Saturday the road their bike for 33 miles. He has been using abdominal belt and it has reduced peristomal hernia.  Current Medications: Outpatient Encounter Medications as of 05/24/2020  Medication Sig  . Ascorbic Acid (VITAMIN C) 500 MG CHEW Chew by mouth daily. Patient reports that he takes 2 per day.  . cholecalciferol (VITAMIN D) 1000 units tablet Take 5,000 Units by mouth daily.   Marland Kitchen doxycycline (ADOXA) 100 MG tablet Take 1 tablet (100 mg total) by mouth 2 (two) times daily.  . melatonin 3 MG TABS  tablet Take 3 mg by mouth at bedtime.  . mesalamine (CANASA) 1000 MG suppository PLACE 1 SUPPOSITORY (1,000 MG TOTAL) RECTALLY AT BEDTIME.  Marland Kitchen nystatin cream (MYCOSTATIN) Apply 1 application topically 2 (two) times daily.  Marland Kitchen nystatin-triamcinolone (MYCOLOG II) cream Apply 1 application topically 2 (two) times daily.  Marland Kitchen OVER THE COUNTER MEDICATION N-ACETYL CYSTEINE  a Supplement  - patient takes 1 by mouth in the morning.  . Probiotic Product (PROBIOTIC DAILY PO) Take 1 capsule by mouth daily.  . rosuvastatin (CRESTOR) 10 MG tablet Take 10 mg by mouth daily.  . Zinc 50 MG CAPS Take 50 mg by mouth daily.  . Tofacitinib Citrate 5 MG TABS Take 1 tablet (5 mg total) by mouth 3 (three) times daily with meals. (Patient not taking: Reported on 02/03/2020)   Facility-Administered Encounter Medications as of 05/24/2020  Medication  . gadobenate dimeglumine (MULTIHANCE) injection 20 mL     Objective: Blood pressure 106/70, pulse 71, temperature 98 F (36.7 C), temperature source Oral, height _0  (1.88 m), weight 214 lb 12.8 oz (97.4 kg). Patient is alert and in no acute distress. He is wearing a mask. Conjunctiva is pink. Sclera is nonicteric Oropharyngeal mucosa is normal. No neck masses or thyromegaly noted. Cardiac exam with regular rhythm normal S1 and S2. No murmur or gallop noted. Lungs are clear to auscultation. Abdomen he has ileostomy in right mid upper abdomen.  He is wearing abdominal belt.  On palpation abdomen is soft and nontender with organomegaly or  masses. Examination of perianal region reveals seton is placed posteriorly.  No induration noted to the right of anal orifice.  Slight induration noted along the left side of the anal orifice with 2 areas with red discoloration but there is no drainage. No LE edema or clubbing noted.  Labs/studies Results:  CBC Latest Ref Rng & Units 03/04/2020 10/28/2019 07/15/2019  WBC 3.8 - 10.8 Thousand/uL 5.1 7.3 6.7  Hemoglobin 13.2 - 17.1 g/dL 15.0  14.9 15.2  Hematocrit 38.5 - 50.0 % 43.7 46.1 45.7  Platelets 140 - 400 Thousand/uL 320 400 285    CMP Latest Ref Rng & Units 03/04/2020 03/27/2019 01/21/2019  Glucose 65 - 139 mg/dL 90 109 92  BUN 7 - 25 mg/dL _0 Creatinine 0.70 - 1.33 mg/dL 0.92 1.01 1.07  Sodium 135 - 146 mmol/L 139 138 137  Potassium 3.5 - 5.3 mmol/L 4.0 4.4 4.6  Chloride 98 - 110 mmol/L 104 104 101  CO2 20 - 32 mmol/L _1 Calcium 8.6 - 10.3 mg/dL 9.8 9.9 9.4  Total Protein 6.1 - 8.1 g/dL 7.7 8.0 7.9  Total Bilirubin 0.2 - 1.2 mg/dL 1.2 1.0 0.7  Alkaline Phos 38 - 126 U/L - - -  AST 10 - 35 U/L _2 ALT 9 - 46 U/L 34 23 26    Hepatic Function Latest Ref Rng & Units 03/04/2020 03/27/2019 01/21/2019  Total Protein 6.1 - 8.1 g/dL 7.7 8.0 7.9  Albumin 3.5 - 5.0 g/dL - - -  AST 10 - 35 U/L _3 ALT 9 - 46 U/L 34 23 26  Alk Phosphatase 38 - 126 U/L - - -  Total Bilirubin 0.2 - 1.2 mg/dL 1.2 1.0 0.7  Bilirubin, Direct 0.0 - 0.2 mg/dL - - -    Lab Results  Component Value Date   CRP 2.5 03/04/2020      Assessment:  #1.  Fistulizing perianal Crohn's disease which has been refractory to multiple agents including diverting ileostomy.  He is back on doxycycline.  He has had multiple courses of Cipro and metronidazole and been concerned about neuropathy and therefore switch antibiotic.  He seem to be responding again.  Unless he has side effects I would continue it for at least 3 months. Plan is to start him on Skyrizi/risankizumab as soon as they are available for clinical use.  Plan:  Patient will go to the lab for CBC, c-Met, CRP and sed rate. Continue doxycycline 100 mg twice daily for another week or so and thereafter 100 mg daily. Will send request for Skyrizi/risankizumab as soon as it is FDA approved. Office visit in 3 months.

## 2020-05-25 LAB — CBC
HCT: 46.1 % (ref 38.5–50.0)
Hemoglobin: 14.8 g/dL (ref 13.2–17.1)
MCH: 27.8 pg (ref 27.0–33.0)
MCHC: 32.1 g/dL (ref 32.0–36.0)
MCV: 86.5 fL (ref 80.0–100.0)
MPV: 9.7 fL (ref 7.5–12.5)
Platelets: 338 10*3/uL (ref 140–400)
RBC: 5.33 10*6/uL (ref 4.20–5.80)
RDW: 13.6 % (ref 11.0–15.0)
WBC: 7.3 10*3/uL (ref 3.8–10.8)

## 2020-05-25 LAB — COMPREHENSIVE METABOLIC PANEL
AG Ratio: 1.3 (calc) (ref 1.0–2.5)
ALT: 18 U/L (ref 9–46)
AST: 19 U/L (ref 10–35)
Albumin: 4.4 g/dL (ref 3.6–5.1)
Alkaline phosphatase (APISO): 97 U/L (ref 35–144)
BUN: 10 mg/dL (ref 7–25)
CO2: 25 mmol/L (ref 20–32)
Calcium: 10.1 mg/dL (ref 8.6–10.3)
Chloride: 104 mmol/L (ref 98–110)
Creat: 0.95 mg/dL (ref 0.70–1.33)
Globulin: 3.5 g/dL (calc) (ref 1.9–3.7)
Glucose, Bld: 87 mg/dL (ref 65–99)
Potassium: 4.8 mmol/L (ref 3.5–5.3)
Sodium: 139 mmol/L (ref 135–146)
Total Bilirubin: 1.2 mg/dL (ref 0.2–1.2)
Total Protein: 7.9 g/dL (ref 6.1–8.1)

## 2020-05-25 LAB — SEDIMENTATION RATE: Sed Rate: 17 mm/h (ref 0–20)

## 2020-05-25 LAB — C-REACTIVE PROTEIN: CRP: 3.3 mg/L (ref <8.0)

## 2020-06-01 ENCOUNTER — Ambulatory Visit (INDEPENDENT_AMBULATORY_CARE_PROVIDER_SITE_OTHER): Payer: 59 | Admitting: Internal Medicine

## 2020-06-07 ENCOUNTER — Other Ambulatory Visit (INDEPENDENT_AMBULATORY_CARE_PROVIDER_SITE_OTHER): Payer: Self-pay | Admitting: Internal Medicine

## 2020-06-07 NOTE — Telephone Encounter (Signed)
Noted to discuss with Dr. Laural Golden.

## 2020-06-11 IMAGING — CT CT ABDOMEN AND PELVIS WITH CONTRAST
2 of 5 series · 15 of 46 positions shown, 17 images · IV contrast (Isovue)
Comparison: None

Correlation: MR abdomen and pelvis 07/26/2015

CLINICAL DATA: Generalized abdominal pain, history Crohn's disease,
repair, fistula

EXAM:
CT ABDOMEN AND PELVIS WITH CONTRAST
TECHNIQUE: Multidetector CT imaging of the abdomen and pelvis was performed
using the standard protocol following bolus administration of
intravenous contrast.
CONTRAST:  100mL OMNIPAQUE IOHEXOL 300 MG/ML  SOLN

[Series 2: axial st · axial · 0.86mm/px · z∈[+925,+1325]mm · 12 of 96 slices shown, 14 images]
[im 8/96  soft-tissue]
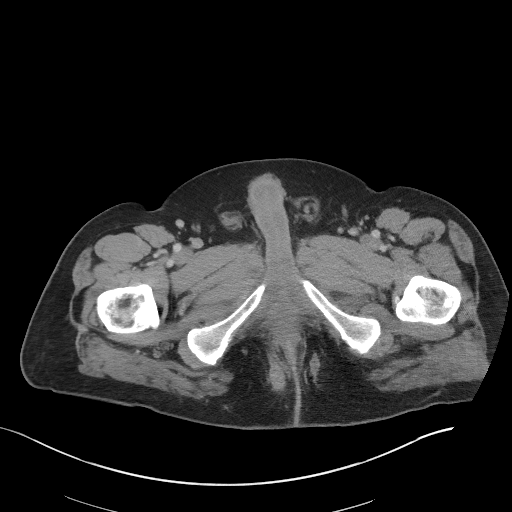
[im 8/96  bone]
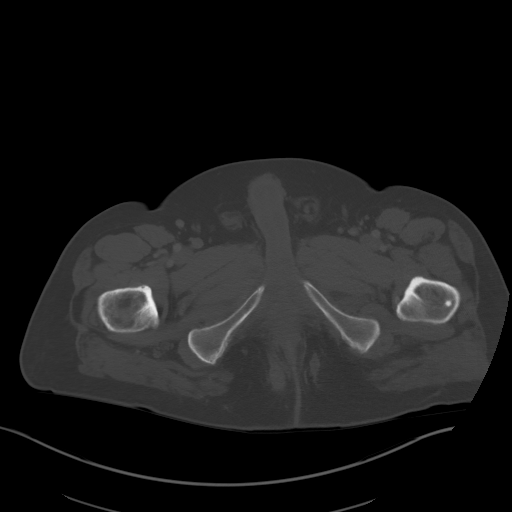
[im 15/96  soft-tissue]
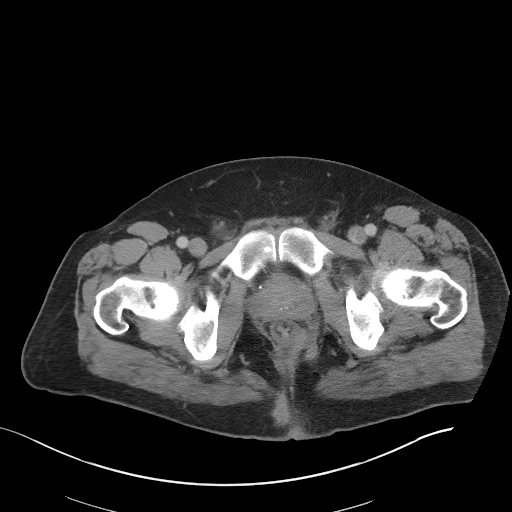
[im 22/96  soft-tissue]
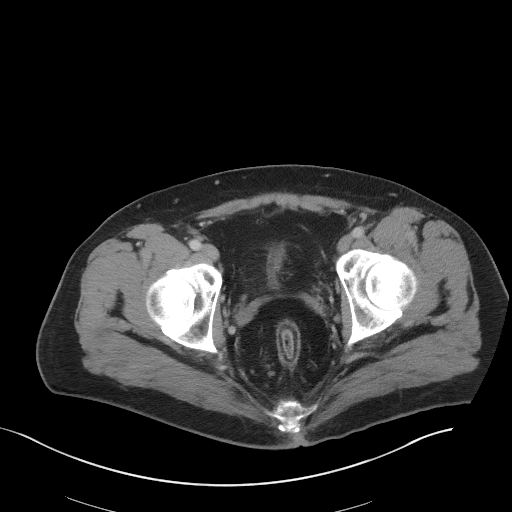
[im 30/96  soft-tissue]
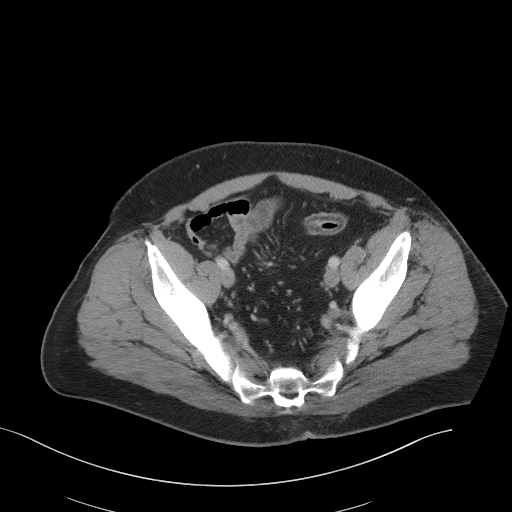
[im 37/96  soft-tissue]
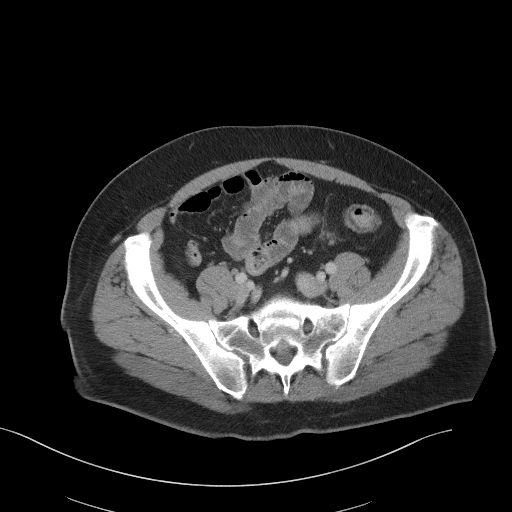
[im 44/96  soft-tissue]
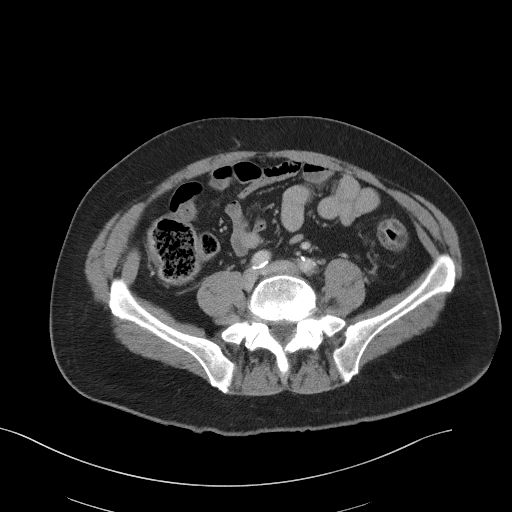
[im 52/96  soft-tissue]
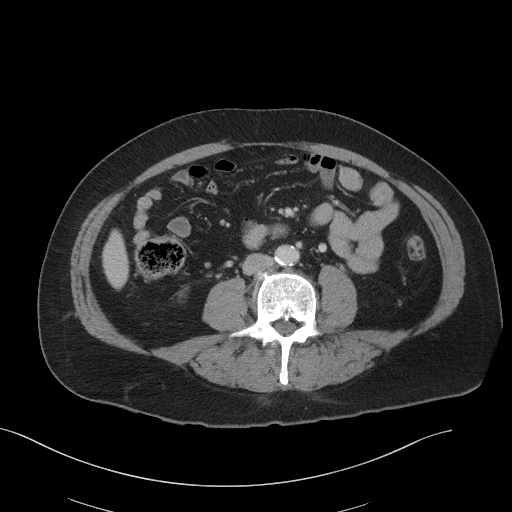
[im 59/96  soft-tissue]
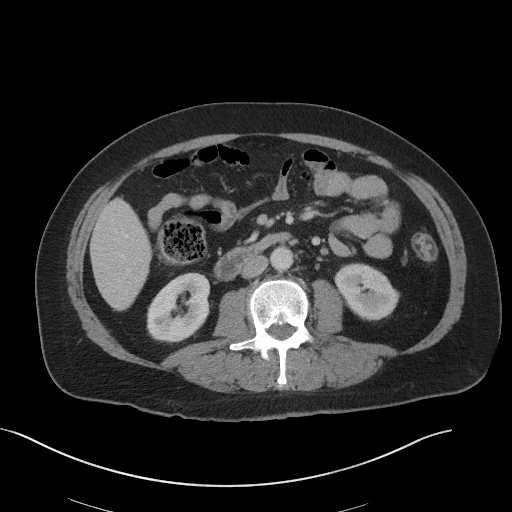
[im 66/96  soft-tissue]
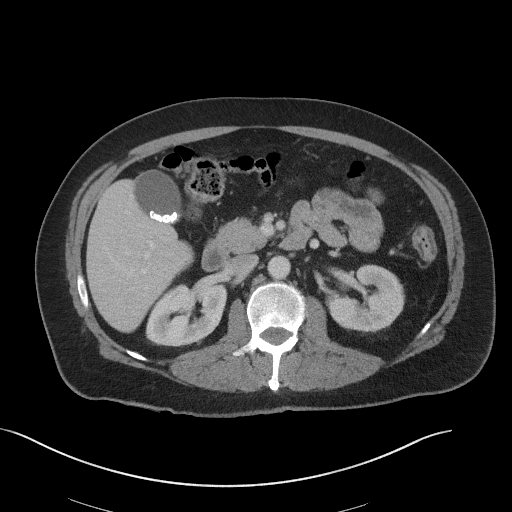
[im 66/96  bone]
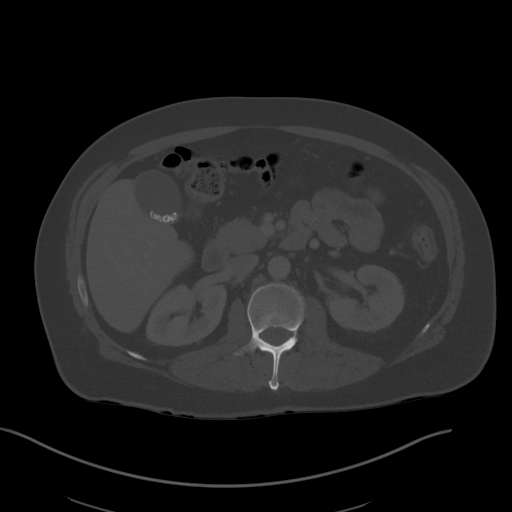
[im 74/96  soft-tissue]
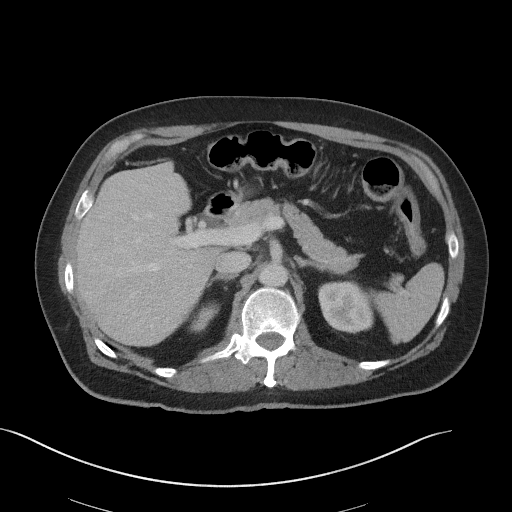
[im 81/96  soft-tissue]
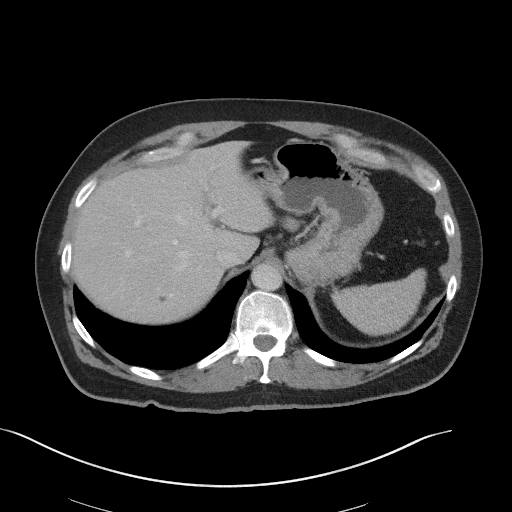
[im 88/96  soft-tissue]
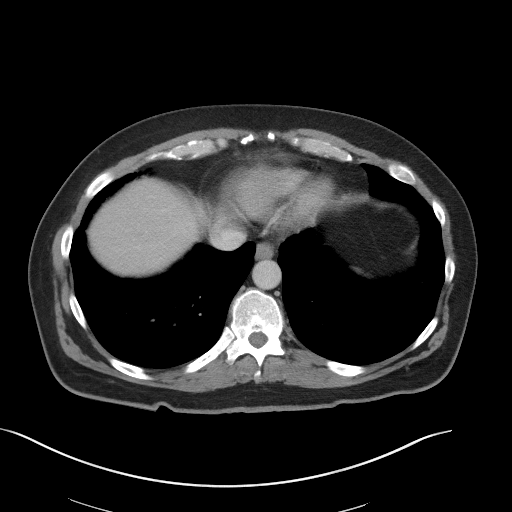

[Series 5: coronal st · coronal · 0.83mm/px · 3 of 104 slices shown]
[im 35/104  soft-tissue]
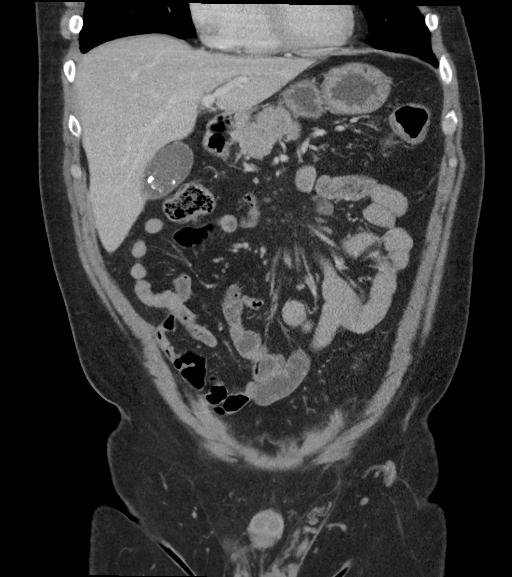
[im 46/104  soft-tissue]
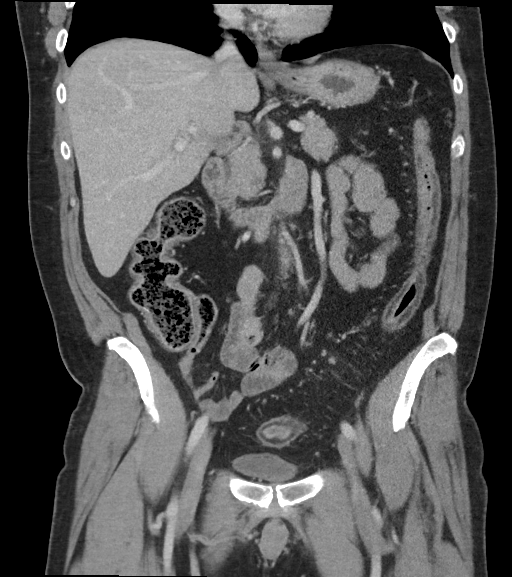
[im 58/104  soft-tissue]
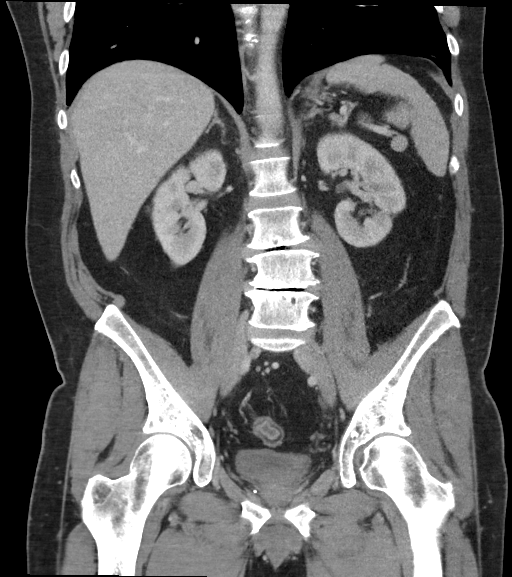

[15 of 46 positions shown; findings below may reference images not displayed]

FINDINGS: Lower chest: Minimal subsegmental atelectasis versus scarring LEFT
lower lobe.

Hepatobiliary: Cholelithiasis. No gallbladder wall thickening. Tiny
cyst superiorly RIGHT lobe 6 mm diameter. Liver otherwise
unremarkable.

Pancreas: Normal appearance

Spleen: Normal appearance.  Small splenule.

Adrenals/Urinary Tract: Adrenal glands normal appearance. Small
BILATERAL renal cysts. No additional renal mass, hydronephrosis or
urinary tract calcification. Bladder and ureters unremarkable

Stomach/Bowel: Normal appendix. Stomach under distended, otherwise
unremarkable. Small bowel loops normal appearance. Fat deposition
within the wall of the descending and sigmoid colon, nonspecific but
can be seen with prior inflammation. Mild hyperemia of the sigmoid
mesocolon with suspicion of mild mucosal hyperenhancement of the
sigmoid colon raising question of subtle colitis. Remaining bowel
loops unremarkable.

Vascular/Lymphatic: Atherosclerotic calcifications of aorta and
iliac arteries without aneurysm scattered normal size
retroperitoneal nodes.

Reproductive: Unremarkable prostate gland and seminal vesicles

Other: No free air or free fluid. Questionable small LEFT inguinal
hernia. Perianal inflammatory changes are seen with tracks extending
posteriorly in the medial aspects of both buttocks. Focal collection
identified at the medial RIGHT buttock, bilobed, with 2 contiguous
collections measuring 21 x 17 mm and 22 x 14 mm, question perianal
abscess. Similar finding on LEFT is smaller 16 x 10 mm. The
RIGHT-side collection extends cranially to adjacent to the coccygeal
tip.

Musculoskeletal: No acute osseous findings.
IMPRESSION: Mild hyperemia of the sigmoid mesocolon with probable mild mucosal
hyperenhancement of the sigmoid colon as well, findings suggestive
of subtle sigmoid colitis, such as from Crohn's disease.

Fat deposition within the wall of the sigmoid colon which may be
related to prior inflammation.

Perianal fistula extending bilaterally into the medial buttocks
where small fluid collections are seen question small abscesses as
above.

Cholelithiasis.

## 2020-09-28 ENCOUNTER — Other Ambulatory Visit (INDEPENDENT_AMBULATORY_CARE_PROVIDER_SITE_OTHER): Payer: Self-pay

## 2020-09-28 ENCOUNTER — Other Ambulatory Visit: Payer: Self-pay

## 2020-09-28 ENCOUNTER — Encounter (INDEPENDENT_AMBULATORY_CARE_PROVIDER_SITE_OTHER): Payer: Self-pay

## 2020-09-28 ENCOUNTER — Ambulatory Visit (INDEPENDENT_AMBULATORY_CARE_PROVIDER_SITE_OTHER): Payer: 59 | Admitting: Internal Medicine

## 2020-09-28 ENCOUNTER — Encounter (INDEPENDENT_AMBULATORY_CARE_PROVIDER_SITE_OTHER): Payer: Self-pay | Admitting: Internal Medicine

## 2020-09-28 VITALS — BP 108/74 | HR 66 | Temp 98.3°F | Ht 74.0 in | Wt 207.0 lb

## 2020-09-28 DIAGNOSIS — K50118 Crohn's disease of large intestine with other complication: Secondary | ICD-10-CM

## 2020-09-28 DIAGNOSIS — K50113 Crohn's disease of large intestine with fistula: Secondary | ICD-10-CM

## 2020-09-28 NOTE — Patient Instructions (Signed)
Flexible sigmoidoscopy to be scheduled Physician will call with results of blood work

## 2020-09-28 NOTE — Progress Notes (Signed)
Presenting complaint;  Follow-up for Crohn's disease.  Database and subjective:  Patient is 60 year old Caucasian male who is here for scheduled visit.  He was last seen on 05/24/2020. He is accompanied by his wife [name]. He has over 15-year history of colonic and perianal fistulizing Crohn's disease which has been refractory to therapy.  He has tried at least 5 different biologic agents.  He has been intolerant of 6-MP.  He also has undergone seton placement on 2 different occasions.  He still has 1 seton in place. He had diverting ileostomy in November 2020 but perianal disease still did not heal despite multiple courses of antibiotics. Patient has been on herbal therapy since 06/16/2020.  He states he feels much better.  He has noted healing of all the fistulae.  He notices scant amount of discharge primarily from around seton.  He does not have perianal or rectal pain.  He also denies rectal bleeding.  His appetite is good.  He has lost 7 pounds since his last visit.  He wants to lose few more pounds.  His goal is to get 295 pounds.  He is losing weight by intermittent fasting.  He is not having any side effects with these medications. He wonders if seton could be removed.  His wife [name] wants to know if mucosal ulceration has healed.  His last colonoscopy was in October 2017 revealing patchy including extensive ulceration at sigmoid colon and rectum. His regiment is as follows.  He is taking 1 teaspoonful of silver daily for 2 weeks, goldenseal achalasia and oregano oil 4 drops daily for 2 weeks Idalia suppository every night for 10 days each month. Other cocktail includes gluten max suppository, stem Zen suppository and mesalamine suppositories.  He uses 4 different suppositories over 4 days and repeat 5 times. He is not having any side effects with this therapy. Total cost of these medications is $600 per month. He is not sure as to the duration of this therapy.   Current  Medications: Outpatient Encounter Medications as of 09/28/2020  Medication Sig   Ascorbic Acid (VITAMIN C) 500 MG CHEW Chew by mouth daily. Patient reports that he takes 2 per day.   cholecalciferol (VITAMIN D) 1000 units tablet Take 5,000 Units by mouth daily.    melatonin 3 MG TABS tablet Take 3 mg by mouth at bedtime.   mesalamine (CANASA) 1000 MG suppository PLACE 1 SUPPOSITORY (1,000 MG TOTAL) RECTALLY AT BEDTIME.   nystatin cream (MYCOSTATIN) Apply 1 application topically 2 (two) times daily.   nystatin-triamcinolone (MYCOLOG II) cream Apply 1 application topically 2 (two) times daily.   OVER THE COUNTER MEDICATION Oil of oregano Vit D 5000 Olive Leaf Extract 750 mg Echinacea with goldenseal root Probiotic, B1 100 mg 2 times daily Areds 2 - multivit for eye   OVER THE COUNTER MEDICATION Idalia supp Stemzen stem cell support suppository [employer] probyo - max probiotic- butyrate suppository Glutamax  high dose glutathione 350 mg glutayhione suppositories   Probiotic Product (PROBIOTIC DAILY PO) Take 1 capsule by mouth daily.   rosuvastatin (CRESTOR) 10 MG tablet Take 10 mg by mouth daily.   Tofacitinib Citrate 5 MG TABS Take 1 tablet (5 mg total) by mouth 3 (three) times daily with meals. (Patient not taking: No sig reported)   [DISCONTINUED] doxycycline (VIBRA-TABS) 100 MG tablet Take 1 tablet (100 mg total) by mouth 2 (two) times daily.   [DISCONTINUED] OVER THE COUNTER MEDICATION N-ACETYL CYSTEINE  a Supplement  - patient takes  1 by mouth in the morning.   [DISCONTINUED] Zinc 50 MG CAPS Take 50 mg by mouth daily.   Facility-Administered Encounter Medications as of 09/28/2020  Medication   gadobenate dimeglumine (MULTIHANCE) injection 20 mL     Objective: Blood pressure 108/74, pulse 66, temperature 98.3 F (36.8 C), temperature source Oral, height _0  (1.88 m), weight 207 lb (93.9 kg). Patient is alert and in no acute distress. Conjunctiva is pink. Sclera is  nonicteric Oropharyngeal mucosa is normal. No neck masses or thyromegaly noted. Cardiac exam with regular rhythm normal S1 and S2. No murmur or gallop noted. Lungs are clear to auscultation. Abdomen.  He has ileostomy in right upper abdomen.  He has peristomal hernia which is more pronounced when he is upright.  On palpation abdomen is soft and nontender with organomegaly or masses. Rectal examination is limited to external exam only.  Delphina Cahill is in place.  3 fistulae on the left side have completely healed.  Skin is not indurated firm or tender.  Similarly there is no induration and tenderness to the right of anal orifice. No LE edema or clubbing noted.  Labs/studies Results:   CBC Latest Ref Rng & Units 05/24/2020 03/04/2020 10/28/2019  WBC 3.8 - 10.8 Thousand/uL 7.3 5.1 7.3  Hemoglobin 13.2 - 17.1 g/dL 14.8 15.0 14.9  Hematocrit 38.5 - 50.0 % 46.1 43.7 46.1  Platelets 140 - 400 Thousand/uL 338 320 400    CMP Latest Ref Rng & Units 05/24/2020 03/04/2020 03/27/2019  Glucose 65 - 99 mg/dL 87 90 109  BUN 7 - 25 mg/dL _1 Creatinine 0.70 - 1.33 mg/dL 0.95 0.92 1.01  Sodium 135 - 146 mmol/L 139 139 138  Potassium 3.5 - 5.3 mmol/L 4.8 4.0 4.4  Chloride 98 - 110 mmol/L 104 104 104  CO2 20 - 32 mmol/L _2 Calcium 8.6 - 10.3 mg/dL 10.1 9.8 9.9  Total Protein 6.1 - 8.1 g/dL 7.9 7.7 8.0  Total Bilirubin 0.2 - 1.2 mg/dL 1.2 1.2 1.0  Alkaline Phos 38 - 126 U/L - - -  AST 10 - 35 U/L _3 ALT 9 - 46 U/L 18 34 23    Hepatic Function Latest Ref Rng & Units 05/24/2020 03/04/2020 03/27/2019  Total Protein 6.1 - 8.1 g/dL 7.9 7.7 8.0  Albumin 3.5 - 5.0 g/dL - - -  AST 10 - 35 U/L _4 ALT 9 - 46 U/L 18 34 23  Alk Phosphatase 38 - 126 U/L - - -  Total Bilirubin 0.2 - 1.2 mg/dL 1.2 1.2 1.0  Bilirubin, Direct 0.0 - 0.2 mg/dL - - -    Lab Results  Component Value Date   CRP 3.3 05/24/2020    Lab data reviewed with patient.  Assessment:  #1.  Colonic and perianal fistulizing  Crohn's disease which has been refractory to multiple therapies.  Peritoneal disease did not even heal with diverting ileostomy followed by pegylated certoluzimab but he has responded to herbal cocktail which consists of oral as well as topical medications.  Suppositories include probiotics, butyrate which should help with mucosal healing.  It would be reasonable to look inside and find out if rectal and sigmoid colitis has healed.  I believe seton could be removed at the time of flexible sigmoidoscopy.  Plan:  Patient will go to the lab for CBC with differential, comprehensive chemistry panel sed rate and CRP. Will schedule flexible sigmoidoscopy under monitored anesthesia care and plan to  remove seton unless Dr. Morton Stall recommends otherwise. Office visit in 4 months.

## 2020-09-29 LAB — COMPREHENSIVE METABOLIC PANEL
AG Ratio: 1.5 (calc) (ref 1.0–2.5)
ALT: 17 U/L (ref 9–46)
AST: 18 U/L (ref 10–35)
Albumin: 4.3 g/dL (ref 3.6–5.1)
Alkaline phosphatase (APISO): 87 U/L (ref 35–144)
BUN: 9 mg/dL (ref 7–25)
CO2: 26 mmol/L (ref 20–32)
Calcium: 9.5 mg/dL (ref 8.6–10.3)
Chloride: 105 mmol/L (ref 98–110)
Creat: 0.85 mg/dL (ref 0.70–1.30)
Globulin: 2.9 g/dL (calc) (ref 1.9–3.7)
Glucose, Bld: 76 mg/dL (ref 65–99)
Potassium: 4.6 mmol/L (ref 3.5–5.3)
Sodium: 138 mmol/L (ref 135–146)
Total Bilirubin: 1.4 mg/dL — ABNORMAL HIGH (ref 0.2–1.2)
Total Protein: 7.2 g/dL (ref 6.1–8.1)

## 2020-09-29 LAB — CBC WITH DIFFERENTIAL/PLATELET
Absolute Monocytes: 574 cells/uL (ref 200–950)
Basophils Absolute: 52 cells/uL (ref 0–200)
Basophils Relative: 0.9 %
Eosinophils Absolute: 191 cells/uL (ref 15–500)
Eosinophils Relative: 3.3 %
HCT: 45.8 % (ref 38.5–50.0)
Hemoglobin: 15 g/dL (ref 13.2–17.1)
Lymphs Abs: 1386 cells/uL (ref 850–3900)
MCH: 28.5 pg (ref 27.0–33.0)
MCHC: 32.8 g/dL (ref 32.0–36.0)
MCV: 86.9 fL (ref 80.0–100.0)
MPV: 9.8 fL (ref 7.5–12.5)
Monocytes Relative: 9.9 %
Neutro Abs: 3596 cells/uL (ref 1500–7800)
Neutrophils Relative %: 62 %
Platelets: 267 10*3/uL (ref 140–400)
RBC: 5.27 10*6/uL (ref 4.20–5.80)
RDW: 13.8 % (ref 11.0–15.0)
Total Lymphocyte: 23.9 %
WBC: 5.8 10*3/uL (ref 3.8–10.8)

## 2020-09-29 LAB — SEDIMENTATION RATE: Sed Rate: 11 mm/h (ref 0–20)

## 2020-09-29 LAB — C-REACTIVE PROTEIN: CRP: 3.2 mg/L (ref <8.0)

## 2020-09-29 NOTE — Progress Notes (Signed)
Message sent to [facility] to send copy to pcp

## 2020-10-28 ENCOUNTER — Ambulatory Visit (HOSPITAL_COMMUNITY)
Admission: RE | Admit: 2020-10-28 | Discharge: 2020-10-28 | Disposition: A | Discharge status: Home / Self Care | Payer: 59 | Attending: Internal Medicine | Admitting: Internal Medicine

## 2020-10-28 ENCOUNTER — Other Ambulatory Visit: Payer: Self-pay

## 2020-10-28 ENCOUNTER — Encounter (HOSPITAL_COMMUNITY): Payer: Self-pay | Admitting: Internal Medicine

## 2020-10-28 ENCOUNTER — Encounter (HOSPITAL_COMMUNITY)
Admission: RE | Disposition: A | Discharge status: Home / Self Care | Payer: Self-pay | Source: Home / Self Care | Attending: Internal Medicine

## 2020-10-28 DIAGNOSIS — Z79899 Other long term (current) drug therapy: Secondary | ICD-10-CM | POA: Diagnosis not present

## 2020-10-28 DIAGNOSIS — K50112 Crohn's disease of large intestine with intestinal obstruction: Secondary | ICD-10-CM | POA: Diagnosis not present

## 2020-10-28 DIAGNOSIS — K50118 Crohn's disease of large intestine with other complication: Secondary | ICD-10-CM | POA: Diagnosis not present

## 2020-10-28 DIAGNOSIS — K50113 Crohn's disease of large intestine with fistula: Secondary | ICD-10-CM | POA: Diagnosis not present

## 2020-10-28 DIAGNOSIS — Z932 Ileostomy status: Secondary | ICD-10-CM | POA: Diagnosis not present

## 2020-10-28 DIAGNOSIS — K625 Hemorrhage of anus and rectum: Secondary | ICD-10-CM | POA: Diagnosis not present

## 2020-10-28 DIAGNOSIS — K5010 Crohn's disease of large intestine without complications: Secondary | ICD-10-CM | POA: Diagnosis present

## 2020-10-28 HISTORY — PX: FLEXIBLE SIGMOIDOSCOPY: SHX5431

## 2020-10-28 HISTORY — PX: BIOPSY: SHX5522

## 2020-10-28 SURGERY — SIGMOIDOSCOPY, FLEXIBLE
Anesthesia: Moderate Sedation

## 2020-10-28 MED ORDER — MIDAZOLAM HCL 5 MG/5ML IJ SOLN
INTRAMUSCULAR | Status: AC
  Filled 2020-10-28: qty 10

## 2020-10-28 MED ORDER — MIDAZOLAM HCL 5 MG/5ML IJ SOLN
INTRAMUSCULAR | Status: DC | PRN
  Administered 2020-10-28 (×3): 2 mg via INTRAVENOUS

## 2020-10-28 MED ORDER — SODIUM CHLORIDE 0.9 % IV SOLN: INTRAVENOUS | Status: DC

## 2020-10-28 MED ORDER — MEPERIDINE HCL 50 MG/ML IJ SOLN
INTRAMUSCULAR | Status: AC
  Filled 2020-10-28: qty 1

## 2020-10-28 MED ORDER — MEPERIDINE HCL 25 MG/ML IJ SOLN
INTRAMUSCULAR | Status: DC | PRN
  Administered 2020-10-28 (×2): 25 mg via INTRAVENOUS

## 2020-10-28 NOTE — H&P (Signed)
Cory [name] is an 60 y.o. male.   Chief Complaint: Patient is here for flexible sigmoidoscopy. HPI: Patient is 60 year old Caucasian male with refractory Crohn's disease involving colon and rectum and Perry anal fistulizing Crohn's disease which has been refractory to therapy.  He underwent diverting ileostomy in November 2020.  Peritoneal disease did not heal with multiple modalities.  He has been using a cocktail of herbal therapy for the last 4 to 5 months and all the perianal fistulae has healed.  He is here to undergo flexible sigmoidoscopy to determine disease activity and hopefully document complete healing of perianal disease as well as sigmoid colon.  He may sometimes has small amount of clear mucus.  He denies abdominal pain or rectal bleeding.  He has good appetite and has been maintaining his weight.  Past Medical History:  Diagnosis Date   Crohn's disease ([place])    History of degenerative disc disease     Past Surgical History:  Procedure Laterality Date   COLONOSCOPY  2013   COLONOSCOPY WITH PROPOFOL N/A 11/19/2015   Procedure: COLONOSCOPY WITH PROPOFOL;  Surgeon: Rogene Houston, MD;  Location: [facility];  Service: Endoscopy;  Laterality: N/A;  random colon biopsy,  hepatic flexure polyp biopsy, sigmoid and rectal biopsy   ESOPHAGOGASTRODUODENOSCOPY (EGD) WITH PROPOFOL N/A 11/19/2015   Procedure: ESOPHAGOGASTRODUODENOSCOPY (EGD) WITH PROPOFOL;  Surgeon: Rogene Houston, MD;  Location: [facility];  Service: Endoscopy;  Laterality: N/A;  biopsy duodenum, gastric biopsy   FLEXIBLE SIGMOIDOSCOPY  01/2012   HERNIA REPAIR     bilateral with mesh   ILEOSTOMY     Right thumb     Compound fracture   VASECTOMY      Family History  Problem Relation Age of Onset   Lymphoma Mother    Melanoma Father    Lung cancer Brother    Healthy Brother    Social History:  reports that he has never smoked. He has never used smokeless tobacco. He reports that he does not drink alcohol and  does not use drugs.  Allergies:  Allergies  Allergen Reactions   Imuran [Azathioprine] Swelling    Join pain , rash   Remicade [Infliximab] Other (See Comments)    Joint pain   Stelara [Ustekinumab] Hives    Patient was a new start to Stelara, rec'd the Stelara IV on 11/09/2017. He developed hives and was given 30 mg of Prednisone. Per patient after being given the Prednisone he noted additional hives,Stelara has been discontinued.   Ppd [Tuberculin Purified Protein Derivative] Hives and Swelling    Joint pain. Patient rec'd both this and Imuran, not sure which caused this.    Medications Prior to Admission  Medication Sig Dispense Refill   Ascorbic Acid (VITAMIN C) 500 MG CHEW Chew 500 mg by mouth daily.     cholecalciferol (VITAMIN D3) 25 MCG (1000 UNIT) tablet Take 2,000 Units by mouth in the morning and at bedtime.     melatonin 3 MG TABS tablet Take 3 mg by mouth at bedtime.     mesalamine (CANASA) 1000 MG suppository PLACE 1 SUPPOSITORY (1,000 MG TOTAL) RECTALLY AT BEDTIME. (Patient taking differently: Place 1,000 mg rectally 4 days.) 90 suppository 1   Multiple Vitamins-Minerals (PRESERVISION AREDS 2+MULTI VIT PO) Take 1 capsule by mouth in the morning and at bedtime.     nystatin cream (MYCOSTATIN) Apply 1 application topically 2 (two) times daily. (Patient taking differently: Apply 1 application topically 2 (two) times daily as needed  for dry skin.) 30 g 1   nystatin-triamcinolone (MYCOLOG II) cream Apply 1 application topically 2 (two) times daily. (Patient taking differently: Apply 1 application topically 2 (two) times daily as needed (irritation).) 30 g 1   OVER THE COUNTER MEDICATION Take 750 mg by mouth daily. Olive Leaf Extract 750 mg     OVER THE COUNTER MEDICATION Place 1 suppository rectally. Stemzen stem cell support suppository; every 4 days     OVER THE COUNTER MEDICATION Place 1 suppository rectally See admin instructions. Idalia Suppository; first 10 days of the  month     OVER THE COUNTER MEDICATION Place 1 suppository rectally. Glutamax  high dose glutathione 350 mg glutayhione suppositories; every 4 days     OVER THE COUNTER MEDICATION Place 1 suppository rectally. [employer] probyo - max probiotic- butyrate suppository; every 4 days     Probiotic Product (PROBIOTIC DAILY PO) Take 1 capsule by mouth daily.     rosuvastatin (CRESTOR) 10 MG tablet Take 10 mg by mouth daily.     thiamine 100 MG tablet Take 100 mg by mouth daily.      No results found for this or any previous visit (from the past 48 hour(s)). No results found.  Review of Systems  Blood pressure 116/77, temperature 98.1 F (36.7 C), temperature source Oral, resp. rate 18, height 6' 2"  (1.88 m), weight 93.4 kg, SpO2 100 %. Physical Exam HENT:     Mouth/Throat:     Mouth: Mucous membranes are moist.     Pharynx: Oropharynx is clear.  Eyes:     General: No scleral icterus.    Conjunctiva/sclera: Conjunctivae normal.  Cardiovascular:     Rate and Rhythm: Normal rate and regular rhythm.     Heart sounds: Normal heart sounds.     Comments: Soft systolic murmur noted at aortic area Pulmonary:     Effort: Pulmonary effort is normal.     Breath sounds: Normal breath sounds.  Abdominal:     Comments: He has ileostomy in right mid abdomen.  Abdomen is soft and nontender with organomegaly or masses.  Musculoskeletal:     Cervical back: Neck supple.  Lymphadenopathy:     Cervical: No cervical adenopathy.  Neurological:     Mental Status: He is alert.     Assessment/Plan  Crohn's disease involving colon rectum and perianal disease with fistulization. With diagnostic flexible sigmoidoscopy to evaluate disease activity.  Hildred Laser, MD 10/28/2020, 1:53 PM

## 2020-10-28 NOTE — Op Note (Addendum)
[facility] Patient Name: Cory [name] Procedure Date: 10/28/2020 1:49 PM MRN: [id number] Date of Birth: April 30, 1960 Attending MD: Hildred Laser , MD CSN: [id number] Age: 60 Admit Type: Outpatient Procedure:                Flexible Sigmoidoscopy Indications:              Crohn's disease of the colon; patient also has                            perianal fistulizing disease. Providers:                Hildred Laser, MD, Caprice Kluver, Randa Spike,                            Technician Referring MD:             Elwyn Lade. Elvina Mattes , MD Medicines:                Meperidine 50 mg IV, Midazolam 6 mg IV Complications:            No immediate complications. Estimated Blood Loss:     Estimated blood loss was minimal. Procedure:                Pre-Anesthesia Assessment:                           - Prior to the procedure, a History and Physical                            was performed, and patient medications and                            allergies were reviewed. The patient's tolerance of                            previous anesthesia was also reviewed. The risks                            and benefits of the procedure and the sedation                            options and risks were discussed with the patient.                            All questions were answered, and informed consent                            was obtained. Prior Anticoagulants: The patient has                            taken no previous anticoagulant or antiplatelet                            agents. ASA Grade Assessment: II - A patient with  mild systemic disease. After reviewing the risks                            and benefits, the patient was deemed in                            satisfactory condition to undergo the procedure.                           After obtaining informed consent, the scope was                            passed under direct vision. The PCF-HQ190L                             ([id number]) scope was introduced through the anus and                            advanced to the the sigmoid colon. The flexible                            sigmoidoscopy was accomplished without difficulty.                            The patient tolerated the procedure well. The                            quality of the bowel preparation was excellent. Scope In: 2:09:06 PM Scope Out: 2:19:58 PM Total Procedure Duration: 0 hours 10 minutes 52 seconds  Findings:      The perianal exam findings include {skip} healed perianal fistulae and       seton in place.      A diffuse area of moderately erythematous friable mucosa with contact       bleeding was found in the rectum.      A benign-appearing, intrinsic moderate stenosis was found in the       recto-sigmoid colon and was traversed with ultraslim scope and not with       pediatric colonoscope      A diffuse area of moderately friable mucosa with contact bleeding was       found in the sigmoid colon. This was biopsied with a cold forceps for       histology. Impression:               - Scarring due to healed perianal fistulae and                            seton in place found on perianal exam. Delphina Cahill was                            removed.                           - Friability with contact bleeding in the rectum  and sigmoid colon. 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                           Comment: Diffuse mucosal erythema granularity and                            friability. 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 Moderate Sedation:      Moderate (conscious) sedation was administered by the endoscopy nurse       and supervised by the endoscopist. The following parameters were       monitored: oxygen saturation,  heart rate, blood pressure, CO2       capnography and response to care. Total physician intraservice time was       17 minutes. Recommendation:           - Discharge patient to home (with spouse).                           - Resume previous diet today.                           - Continue present medications.                           - Await pathology results. Procedure Code(s):        --- Professional ---                           [phone], Sigmoidoscopy, flexible; with biopsy, single                            or multiple                           G0500, Moderate sedation services provided by the                            same physician or other qualified health care                            professional performing a gastrointestinal                            endoscopic service that sedation supports,  requiring the presence of an independent trained                            observer to assist in the monitoring of the                            patient's level of consciousness and physiological                            status; initial 15 minutes of intra-service time;                            patient age 30 years or older (additional time may                            be reported with [phone], as appropriate) Diagnosis Code(s):        --- Professional ---                           K62.5, Hemorrhage of anus and rectum CPT copyright 2019 [employer]. All rights reserved. The codes documented in this report are preliminary and upon coder review may  be revised to meet current compliance requirements. Hildred Laser, MD Hildred Laser, MD 10/28/2020 2:39:26 PM This report has been signed electronically. Number of Addenda: 1 Addendum Number: 1   Addendum Date: 11/02/2020 10:26:17 AM      one seton was in place.      It was removed without difficulty Hildred Laser, MD Hildred Laser, MD 11/02/2020 10:29:40 AM This report has been signed  electronically.

## 2020-10-28 NOTE — Discharge Instructions (Signed)
Resume usual medications and diet as before. No driving for 24 hours. Physician will call with biopsy results and further recommendations.

## 2020-11-01 LAB — SURGICAL PATHOLOGY

## 2020-11-03 ENCOUNTER — Encounter (HOSPITAL_COMMUNITY): Payer: Self-pay | Admitting: Internal Medicine

## 2021-01-30 HISTORY — PX: MOHS SURGERY: SUR867

## 2021-02-01 ENCOUNTER — Encounter (INDEPENDENT_AMBULATORY_CARE_PROVIDER_SITE_OTHER): Payer: Self-pay | Admitting: Internal Medicine

## 2021-02-01 ENCOUNTER — Other Ambulatory Visit: Payer: Self-pay

## 2021-02-01 ENCOUNTER — Ambulatory Visit (INDEPENDENT_AMBULATORY_CARE_PROVIDER_SITE_OTHER): Payer: Federal, State, Local not specified - PPO | Admitting: Internal Medicine

## 2021-02-01 VITALS — BP 116/78 | HR 82 | Temp 98.0°F | Ht 74.0 in | Wt 220.5 lb

## 2021-02-01 DIAGNOSIS — K50113 Crohn's disease of large intestine with fistula: Secondary | ICD-10-CM | POA: Diagnosis not present

## 2021-02-01 NOTE — Progress Notes (Signed)
Presenting complaint;  Follow-up for Crohn's colitis and perianal fistulizing disease.  Database hand subjective:  Patient is 61 year old Caucasian male with a history of inflammatory bowel disease for about 18 years who was initially thought to have ulcerative colitis but subsequently proven to have Crohn's disease. His disease has been refractory to therapy.  He did not respond to oral mesalamine.  He has been intolerant of 6-MP and he has been tried on all of the biologics except the new ones and had transient on no response to therapy.  Over the years he has required repeated courses of antibiotic therapy and seton placement. He finally had diverting loop ileostomy in 2020 and was maintained on Certoluzimab for several months as recommended by Dr. Delfino Lovett [place](IBD specialist at [facility]).  Perianal disease did not heal despite ileostomy and anti-TNF alpha antibody.  This medication was discontinued.  He finally decided to try herbal therapy in May 2022.  This therapy included teaspoons of silver daily for 2 weeks goldenseal Echinacea, oregano oil 4 drops daily for 2 weeks, ideally a suppository every night for 10 days of each month, Gluchonmax suppository, stem Zen suppository as well as mesalamine suppositories he uses suppositories over 4 days and repeated 5 times each month. When I saw him on August 2020 all of his fistula had healed and he was feeling much better. He had flexible sigmoidoscopy on October 28, 2020 and there was no evidence of ulcers in the rectum or sigmoid colon as well as no fistulous openings.  He was found to have developed noncritical narrowing at rectosigmoid junction.  Brianna states he stopped taking herbal cocktail 1 month ago.  He also has not used mesalamine suppositories.  Similarly he is not using nystatin cream either.  Now he is noticing scant amount of clear discharge per rectum no more than once a day.  He reports no rectal bleeding or perianal pain.  He has no  pain when he rides his bike which he and his wife does often.  He feels his quality of life is very good.  Only difference he sees since he has had the disease as that his muscle strength is not as good as it used to be.  He feels retirement and also helped as there is no more work-related stress.  His appetite is good.  He has gained 13 pounds since his last visit  Current Medications: Outpatient Encounter Medications as of 02/01/2021  Medication Sig   Ascorbic Acid (VITAMIN C) 500 MG CHEW Chew 500 mg by mouth daily.   cholecalciferol (VITAMIN D3) 25 MCG (1000 UNIT) tablet Take 2,000 Units by mouth in the morning and at bedtime.   melatonin 3 MG TABS tablet Take 3 mg by mouth at bedtime.   mesalamine (CANASA) 1000 MG suppository PLACE 1 SUPPOSITORY (1,000 MG TOTAL) RECTALLY AT BEDTIME. (Patient taking differently: Place 1,000 mg rectally 4 days.)   Multiple Vitamins-Minerals (PRESERVISION AREDS 2+MULTI VIT PO) Take 1 capsule by mouth in the morning and at bedtime.   nystatin cream (MYCOSTATIN) Apply 1 application topically 2 (two) times daily. (Patient taking differently: Apply 1 application topically 2 (two) times daily as needed for dry skin.)   nystatin-triamcinolone (MYCOLOG II) cream Apply 1 application topically 2 (two) times daily. (Patient taking differently: Apply 1 application topically 2 (two) times daily as needed (irritation).)   OVER THE COUNTER MEDICATION Take 750 mg by mouth daily. Olive Leaf Extract 750 mg   OVER THE COUNTER MEDICATION Place 1 suppository rectally. Stemzen  stem cell support suppository; every 4 days   OVER THE COUNTER MEDICATION Place 1 suppository rectally See admin instructions. Idalia Suppository; first 10 days of the month   OVER THE COUNTER MEDICATION Place 1 suppository rectally. Glutamax  high dose glutathione 350 mg glutayhione suppositories; every 4 days   OVER THE COUNTER MEDICATION Place 1 suppository rectally. [employer] probyo - max probiotic-  butyrate suppository; every 4 days   Probiotic Product (PROBIOTIC DAILY PO) Take 1 capsule by mouth daily.   rosuvastatin (CRESTOR) 10 MG tablet Take 10 mg by mouth daily.   thiamine 100 MG tablet Take 100 mg by mouth daily.   Facility-Administered Encounter Medications as of 02/01/2021  Medication   gadobenate dimeglumine (MULTIHANCE) injection 20 mL     Objective: Blood pressure 116/78, pulse 82, temperature 98 F (36.7 C), temperature source Oral, height _0  (1.88 m), weight 220 lb 8 oz (100 kg). Patient is alert and in no acute distress. Conjunctiva is pink. Sclera is nonicteric Oropharyngeal mucosa is normal. No neck masses or thyromegaly noted. Cardiac exam with regular rhythm normal S1 and S2. No murmur or gallop noted. Lungs are clear to auscultation. Abdomen he has ileostomy in right mid upper abdomen.  He has parastomal hernia.  On palpation is soft and nontender with organomegaly or masses. Perianal exam reveals healed fistulae 3 scars on the left side and 2 on the right side.  On palpation there is mild firmness but no induration tenderness or fluctuation. No LE edema or clubbing noted.  Labs/studies Results:   CBC Latest Ref Rng & Units 09/28/2020 05/24/2020 03/04/2020  WBC 3.8 - 10.8 Thousand/uL 5.8 7.3 5.1  Hemoglobin 13.2 - 17.1 g/dL 15.0 14.8 15.0  Hematocrit 38.5 - 50.0 % 45.8 46.1 43.7  Platelets 140 - 400 Thousand/uL 267 338 320    CMP Latest Ref Rng & Units 09/28/2020 05/24/2020 03/04/2020  Glucose 65 - 99 mg/dL 76 87 90  BUN 7 - 25 mg/dL _1 Creatinine 0.70 - 1.30 mg/dL 0.85 0.95 0.92  Sodium 135 - 146 mmol/L 138 139 139  Potassium 3.5 - 5.3 mmol/L 4.6 4.8 4.0  Chloride 98 - 110 mmol/L 105 104 104  CO2 20 - 32 mmol/L _2 Calcium 8.6 - 10.3 mg/dL 9.5 10.1 9.8  Total Protein 6.1 - 8.1 g/dL 7.2 7.9 7.7  Total Bilirubin 0.2 - 1.2 mg/dL 1.4(H) 1.2 1.2  Alkaline Phos 38 - 126 U/L - - -  AST 10 - 35 U/L _3 ALT 9 - 46 U/L 17 18 34    Hepatic  Function Latest Ref Rng & Units 09/28/2020 05/24/2020 03/04/2020  Total Protein 6.1 - 8.1 g/dL 7.2 7.9 7.7  Albumin 3.5 - 5.0 g/dL - - -  AST 10 - 35 U/L _4 ALT 9 - 46 U/L 17 18 34  Alk Phosphatase 38 - 126 U/L - - -  Total Bilirubin 0.2 - 1.2 mg/dL 1.4(H) 1.2 1.2  Bilirubin, Direct 0.0 - 0.2 mg/dL - - -    Lab Results  Component Value Date   CRP 3.2 09/28/2020     Assessment:  #1.  Crohn's disease involving colon along with perianal fistula lysing Crohn's disease.  He failed 6 different Biologics and he was intolerant of 6-MP.  He had loop diverting ileostomy in November 2020.  Perianal disease did not improve despite this intervention and use of certolizumab pegol which was subsequently discontinued.  He  then took cocktail of multiple herbal preparations for over 6 months and perianal disease healed. He has stopped all of these preps and he seemed to be doing fine. I discussed this condition with Dr. Cheryll Cockayne who is patient's surgeon at [facility].  He recommended not reversing ileostomy as disease would likely relapse. Therefore will continue to monitor his course closely.    Plan:  Medication list updated.  All of the medication that he is not taking were deleted. Patient will call if he has rectal discharge or pain. He will arrange office visit with Dr. Morton Stall in middle of the year. Office visit in 4 months. He will have CBC with differential, comprehensive chemistry panel prior to that visit.

## 2021-02-01 NOTE — Patient Instructions (Addendum)
Blood work to be done prior to next visit

## 2021-02-03 NOTE — Progress Notes (Signed)
error 

## 2021-05-13 ENCOUNTER — Other Ambulatory Visit (INDEPENDENT_AMBULATORY_CARE_PROVIDER_SITE_OTHER): Payer: Self-pay | Admitting: *Deleted

## 2021-05-13 DIAGNOSIS — K50119 Crohn's disease of large intestine with unspecified complications: Secondary | ICD-10-CM

## 2021-05-13 DIAGNOSIS — R17 Unspecified jaundice: Secondary | ICD-10-CM

## 2021-06-07 LAB — CBC WITH DIFFERENTIAL/PLATELET
Absolute Monocytes: 656 cells/uL (ref 200–950)
Basophils Absolute: 69 cells/uL (ref 0–200)
Basophils Relative: 1 %
Eosinophils Absolute: 221 cells/uL (ref 15–500)
Eosinophils Relative: 3.2 %
HCT: 42.8 % (ref 38.5–50.0)
Hemoglobin: 14 g/dL (ref 13.2–17.1)
Lymphs Abs: 1346 cells/uL (ref 850–3900)
MCH: 28.1 pg (ref 27.0–33.0)
MCHC: 32.7 g/dL (ref 32.0–36.0)
MCV: 85.9 fL (ref 80.0–100.0)
MPV: 9.1 fL (ref 7.5–12.5)
Monocytes Relative: 9.5 %
Neutro Abs: 4609 cells/uL (ref 1500–7800)
Neutrophils Relative %: 66.8 %
Platelets: 375 10*3/uL (ref 140–400)
RBC: 4.98 10*6/uL (ref 4.20–5.80)
RDW: 13.1 % (ref 11.0–15.0)
Total Lymphocyte: 19.5 %
WBC: 6.9 10*3/uL (ref 3.8–10.8)

## 2021-06-07 LAB — COMPREHENSIVE METABOLIC PANEL
AG Ratio: 1.1 (calc) (ref 1.0–2.5)
ALT: 21 U/L (ref 9–46)
AST: 17 U/L (ref 10–35)
Albumin: 4.1 g/dL (ref 3.6–5.1)
Alkaline phosphatase (APISO): 127 U/L (ref 35–144)
BUN: 11 mg/dL (ref 7–25)
CO2: 25 mmol/L (ref 20–32)
Calcium: 9.5 mg/dL (ref 8.6–10.3)
Chloride: 105 mmol/L (ref 98–110)
Creat: 0.91 mg/dL (ref 0.70–1.35)
Globulin: 3.8 g/dL (calc) — ABNORMAL HIGH (ref 1.9–3.7)
Glucose, Bld: 85 mg/dL (ref 65–99)
Potassium: 4.4 mmol/L (ref 3.5–5.3)
Sodium: 139 mmol/L (ref 135–146)
Total Bilirubin: 0.6 mg/dL (ref 0.2–1.2)
Total Protein: 7.9 g/dL (ref 6.1–8.1)

## 2021-06-14 ENCOUNTER — Ambulatory Visit (INDEPENDENT_AMBULATORY_CARE_PROVIDER_SITE_OTHER): Payer: Medicare Other | Admitting: Internal Medicine

## 2021-06-14 ENCOUNTER — Encounter (INDEPENDENT_AMBULATORY_CARE_PROVIDER_SITE_OTHER): Payer: Self-pay | Admitting: Internal Medicine

## 2021-06-14 VITALS — BP 117/81 | HR 73 | Temp 98.1°F | Ht 74.0 in | Wt 218.9 lb

## 2021-06-14 DIAGNOSIS — K50113 Crohn's disease of large intestine with fistula: Secondary | ICD-10-CM | POA: Diagnosis not present

## 2021-06-14 MED ORDER — CIPROFLOXACIN HCL 500 MG PO TABS: 500.0000 mg | ORAL_TABLET | Freq: Two times a day (BID) | ORAL | 0 refills | Status: DC

## 2021-06-14 MED ORDER — METRONIDAZOLE 500 MG PO TABS: 500.0000 mg | ORAL_TABLET | Freq: Two times a day (BID) | ORAL | 0 refills | Status: DC

## 2021-06-14 NOTE — Progress Notes (Signed)
Presenting complaint;  Follow-up for Crohn's disease.  Database and subjective:  61 year old Caucasian male with 18-year history of inflammatory bowel disease which was finally confirmed to be Crohn's disease presented for scheduled visit.  He was last seen on 02/01/2021 and he was doing well. He has a history of colonic and perianal fistulizing disease and has failed multiple therapies(infliximab, adalimumab, vedolizumab, ustekinumab, tofacitinib and certolizumab) who underwent loop ileostomy in November 2020 and perianal disease that did not heal.  He has required periodic courses of antibiotics with temporary relief.  Last year he underwent cocktail therapy given to him by neurologist and all his fistulae healed.  He was doing well when I saw him in January this year. Zoe states drainage started on 03/02/2021 and he also noted some bleeding and mucus.  He decided to go back on the herbal cocktail at this time it is not helping as it did the last time.  His wife [name] states she had to lance 1 abscess recently for relief.  He denies abdominal pain fever or chills.  He remains with good appetite.  He has not noted any change in ileostomy output.  He says drainage is very small and he only has to change pad once a day. He is using 4 different suppositories any alternates time.  Suppositories include mesalamine, idalia, glutamax and probyo max with butyrate.   Current Medications: Outpatient Encounter Medications as of 06/14/2021  Medication Sig   Ascorbic Acid (VITAMIN C) 500 MG CHEW Chew 500 mg by mouth daily.   cholecalciferol (VITAMIN D3) 25 MCG (1000 UNIT) tablet Take 2,000 Units by mouth in the morning and at bedtime.   melatonin 3 MG TABS tablet Take 3 mg by mouth at bedtime.   mesalamine (CANASA) 1000 MG suppository PLACE 1 SUPPOSITORY (1,000 MG TOTAL) RECTALLY AT BEDTIME. (Patient taking differently: Place 1,000 mg rectally 4 days.)   nystatin cream (MYCOSTATIN) Apply 1 application topically  2 (two) times daily. (Patient taking differently: Apply 1 application. topically 2 (two) times daily as needed for dry skin.)   nystatin-triamcinolone (MYCOLOG II) cream Apply 1 application topically 2 (two) times daily. (Patient taking differently: Apply 1 application. topically 2 (two) times daily as needed (irritation).)   OVER THE COUNTER MEDICATION Take 750 mg by mouth daily. Olive Leaf Extract 750 mg   OVER THE COUNTER MEDICATION Place 1 suppository rectally. Glutamax  high dose glutathione 350 mg glutayhione suppositories; every 4 days   Probiotic Product (PROBIOTIC DAILY PO) Take 1 capsule by mouth daily.   thiamine 100 MG tablet Take 100 mg by mouth daily.   Multiple Vitamins-Minerals (PRESERVISION AREDS 2+MULTI VIT PO) Take 1 capsule by mouth in the morning and at bedtime. (Patient not taking: Reported on 06/14/2021)   rosuvastatin (CRESTOR) 10 MG tablet Take 10 mg by mouth daily. (Patient not taking: Reported on 06/14/2021)   Facility-Administered Encounter Medications as of 06/14/2021  Medication   gadobenate dimeglumine (MULTIHANCE) injection 20 mL     Objective: Blood pressure 117/81, pulse 73, temperature 98.1 F (36.7 C), temperature source Oral, height 6' 2"  (1.88 m), weight 218 lb 14.4 oz (99.3 kg). Patient is alert and in no acute distress. Conjunctiva is pink. Sclera is nonicteric Oropharyngeal mucosa is normal. No neck masses or thyromegaly noted. Cardiac exam with regular rhythm normal S1 and S2. No murmur or gallop noted. Lungs are clear to auscultation. Abdomen. Ileostomy is located in the right mid abdomen there isPeristomal hernia which is more pronounced when he is upright.  On palpation abdomen is soft and nontender with organomegaly or masses Rectal examination is limited to external inspection.  There is a open area to the left of the anal orifice with induration and tenderness and scant amount of discharge.  There is another opening to the left of umbilicus with  some induration. No LE edema or clubbing noted.  Labs/studies Results:      Latest Ref Rng & Units 06/06/2021   10:30 AM 09/28/2020   12:18 PM 05/24/2020    1:41 PM  CBC  WBC 3.8 - 10.8 Thousand/uL 6.9   5.8   7.3    Hemoglobin 13.2 - 17.1 g/dL 14.0   15.0   14.8    Hematocrit 38.5 - 50.0 % 42.8   45.8   46.1    Platelets 140 - 400 Thousand/uL 375   267   338         Latest Ref Rng & Units 06/06/2021   10:30 AM 09/28/2020   12:18 PM 05/24/2020    1:41 PM  CMP  Glucose 65 - 99 mg/dL 85   76   87    BUN 7 - 25 mg/dL 11   9   10     Creatinine 0.70 - 1.35 mg/dL 0.91   0.85   0.95    Sodium 135 - 146 mmol/L 139   138   139    Potassium 3.5 - 5.3 mmol/L 4.4   4.6   4.8    Chloride 98 - 110 mmol/L 105   105   104    CO2 20 - 32 mmol/L 25   26   25     Calcium 8.6 - 10.3 mg/dL 9.5   9.5   10.1    Total Protein 6.1 - 8.1 g/dL 7.9   7.2   7.9    Total Bilirubin 0.2 - 1.2 mg/dL 0.6   1.4   1.2    AST 10 - 35 U/L 17   18   19     ALT 9 - 46 U/L 21   17   18          Latest Ref Rng & Units 06/06/2021   10:30 AM 09/28/2020   12:18 PM 05/24/2020    1:41 PM  Hepatic Function  Total Protein 6.1 - 8.1 g/dL 7.9   7.2   7.9    AST 10 - 35 U/L 17   18   19     ALT 9 - 46 U/L 21   17   18     Total Bilirubin 0.2 - 1.2 mg/dL 0.6   1.4   1.2      Above lab data reviewed with patient.   Assessment:  #1.  Colonic and fistulizing perianal Crohn's disease.  Perianal fistulae has returned.  He is using combination of mesalamine suppositories along with herbal therapy which has worked in the past but not so far.  He would benefit from short course of antibiotic. If herbal therapy does not work will start him on risankizumab.  Plan:  Cipro 500 mg by mouth twice daily for 2 weeks. Metronidazole 500 mg by mouth twice daily for 2 weeks. Patient will call with progress report in 2 weeks. Office visit with Dr. Jenetta Downer in 3 months

## 2021-06-14 NOTE — Patient Instructions (Signed)
Please call with progress report in 2 weeks ?

## 2021-06-30 ENCOUNTER — Other Ambulatory Visit (INDEPENDENT_AMBULATORY_CARE_PROVIDER_SITE_OTHER): Payer: Self-pay | Admitting: Internal Medicine

## 2021-06-30 MED ORDER — METRONIDAZOLE 500 MG PO TABS: 500.0000 mg | ORAL_TABLET | Freq: Two times a day (BID) | ORAL | 0 refills | Status: DC

## 2021-06-30 MED ORDER — CIPROFLOXACIN HCL 500 MG PO TABS: 500.0000 mg | ORAL_TABLET | Freq: Two times a day (BID) | ORAL | 0 refills | Status: DC

## 2021-07-01 ENCOUNTER — Other Ambulatory Visit (INDEPENDENT_AMBULATORY_CARE_PROVIDER_SITE_OTHER): Payer: Self-pay | Admitting: *Deleted

## 2021-07-01 MED ORDER — CIPROFLOXACIN HCL 500 MG PO TABS: 500.0000 mg | ORAL_TABLET | Freq: Two times a day (BID) | ORAL | 0 refills | Status: DC

## 2021-07-01 MED ORDER — METRONIDAZOLE 500 MG PO TABS: 500.0000 mg | ORAL_TABLET | Freq: Two times a day (BID) | ORAL | 0 refills | Status: DC

## 2021-08-10 ENCOUNTER — Other Ambulatory Visit (INDEPENDENT_AMBULATORY_CARE_PROVIDER_SITE_OTHER): Payer: Self-pay | Admitting: Gastroenterology

## 2021-08-10 ENCOUNTER — Telehealth: Payer: Self-pay

## 2021-08-10 DIAGNOSIS — K603 Anal fistula, unspecified: Secondary | ICD-10-CM

## 2021-08-10 DIAGNOSIS — K50913 Crohn's disease, unspecified, with fistula: Secondary | ICD-10-CM

## 2021-08-10 MED ORDER — METRONIDAZOLE 500 MG PO TABS: 500.0000 mg | ORAL_TABLET | Freq: Two times a day (BID) | ORAL | 0 refills | Status: AC

## 2021-08-10 MED ORDER — CIPROFLOXACIN HCL 500 MG PO TABS: 500.0000 mg | ORAL_TABLET | Freq: Two times a day (BID) | ORAL | 0 refills | Status: AC

## 2021-08-10 MED ORDER — METRONIDAZOLE 500 MG PO TABS: 500.0000 mg | ORAL_TABLET | Freq: Two times a day (BID) | ORAL | 0 refills | Status: DC

## 2021-08-10 MED ORDER — CIPROFLOXACIN HCL 500 MG PO TABS: 500.0000 mg | ORAL_TABLET | Freq: Two times a day (BID) | ORAL | 0 refills | Status: DC

## 2021-08-10 NOTE — Telephone Encounter (Signed)
Patient called today stating he is having what he thinks is a crohns flare. He says he has two open fistulas that are draining, no fever,but has chills. Denies any other issue. Says wife saw Dr.Castaneda at the hospital and told what was going on and they were instructed to call the office so Cipro and Flagyl could be sent in to [place]. Please advise.

## 2021-08-10 NOTE — Telephone Encounter (Signed)
I spoke with the patient's wife today, we will give him 3-week course of antibiotics which were sent to his pharmacy.  He needs to follow-up in the clinic to assess if drainage has improved prior to consideration of starting him of medications such as Skyrizi or Rinvoq

## 2021-08-10 NOTE — Telephone Encounter (Signed)
Patient aware of all.

## 2021-08-29 ENCOUNTER — Ambulatory Visit (INDEPENDENT_AMBULATORY_CARE_PROVIDER_SITE_OTHER): Payer: Medicare Other | Admitting: Gastroenterology

## 2021-08-29 ENCOUNTER — Encounter (INDEPENDENT_AMBULATORY_CARE_PROVIDER_SITE_OTHER): Payer: Self-pay | Admitting: Gastroenterology

## 2021-08-29 VITALS — BP 118/76 | HR 80 | Temp 98.1°F | Ht 74.0 in | Wt 221.8 lb

## 2021-08-29 DIAGNOSIS — K50113 Crohn's disease of large intestine with fistula: Secondary | ICD-10-CM

## 2021-08-29 NOTE — Patient Instructions (Addendum)
Finish antibiotic course Continue mesalamine, idalia, glutamax and probyo max with butyrate enemas If recurrence of symptoms, please reach [place] back for possible pelvic MRI

## 2021-08-29 NOTE — Progress Notes (Unsigned)
Cory [name], M.D. Gastroenterology & Hepatology [facility] [address] [place], [place] [address]  Primary Care Physician: Cory [name], [place] [address]  I will communicate my assessment and recommendations to the referring MD via EMR.  Problems: Colonic Crohn's disease (multiple segments) with perianal disease Recurrent  History of Present Illness: Cory [name] is a 61 y.o. male with PMH Crohn's disease with colonic CD with perianal disease, who presents for follow up of Crohn's disease.  The patient was last seen on 06/14/2021. At that time, the patient was given a prescription for ciprofloxacin and metronidazole for recurrent perianal fistula.  He presented some improvement of his symptoms with his antibiotic regimen.  He was previously using suppositories which included mesalamine, Cory [name], glutamax and probyo max with butyrate. He reports that he stopped them in February as he felt well without induration or discomfort. He states that in July he presented recurrent symptoms and drainage. Patient reports that he has been on ciprofloxacin and Flagyl since 08/10/2021 for fistula drainage after he called to our office complaining about his symptoms (his wife also talk to me about his case as a curbside in the hospital).  He reports that he feeling very well after starting antibiotics and currently he has very scant drainage from his fistula.  States that he uses some pad but these get slightly wet during the day.  Regarding his chronic disease, he was diagnosed around 2005-6. He has had at least two setons placed in the past, with last one placed in 01/2020 and removed in 09/2020.  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 The patient reports that he only had perianal disease most of the time but based on colonoscopy report from 2017 there was some evidence of active  inflammation in the cecum, hepatic flexure, sigmoid and rectum.  There was presence of chronic changes and inflammation throughout the colon in the biopsies. Patient has tried infliximab, adalimumab, vedolizumab, ustekinumab, tofacitinib and certolizumab in the past.  He has an average 5 bowel movements in his bag which has not changed in frequency. The patient denies having any nausea, vomiting, fever, chills, hematochezia, melena, hematemesis, abdominal distention, abdominal pain, diarrhea, jaundice, pruritus or weight loss.  Last Colonoscopy: 10/2015 - The examined portion of the ileum was normal. - Skip disease with extensive ulceration involving cecal mucosa, descending colon, sigmoid colon and rectum. 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 - Endoscopic findings consistent with fistula arising Crohn's disease involving colon and rectum.  Past Medical History: Past Medical History:  Diagnosis Date   Crohn's disease ([place])    History of degenerative disc disease     Past Surgical History: Past Surgical History:  Procedure Laterality Date   BIOPSY  10/28/2020   Procedure: BIOPSY;  Surgeon: Rogene Houston, MD;  [facility];  Service: Endoscopy;;   COLONOSCOPY  2013   COLONOSCOPY WITH PROPOFOL N/A 11/19/2015   Procedure: COLONOSCOPY WITH PROPOFOL;  Surgeon: Rogene Houston, MD;  [facility];  Service: Endoscopy;  Laterality: N/A;  random colon biopsy,  hepatic flexure polyp biopsy, sigmoid and rectal biopsy   ESOPHAGOGASTRODUODENOSCOPY (EGD) WITH PROPOFOL N/A 11/19/2015   Procedure: ESOPHAGOGASTRODUODENOSCOPY (EGD) WITH PROPOFOL;  Surgeon: Rogene Houston, MD;  [facility];  Service: Endoscopy;  Laterality: N/A;  biopsy duodenum, gastric biopsy   FLEXIBLE SIGMOIDOSCOPY  01/2012   FLEXIBLE SIGMOIDOSCOPY N/A 10/28/2020   Procedure: FLEXIBLE SIGMOIDOSCOPY;  Surgeon: Rogene Houston, MD;  [facility];  Service: Endoscopy;  Laterality: N/A;   12:50   HERNIA REPAIR     bilateral with mesh   ILEOSTOMY     Right thumb     Compound fracture   VASECTOMY      Family History: Family History  Problem Relation Age of Onset   Lymphoma Mother    Melanoma Father    Lung cancer Brother    Healthy Brother     Social History: Social History   Tobacco Use  Smoking Status Never   Passive exposure: Never  Smokeless Tobacco Never   Social History   Substance and Sexual Activity  Alcohol Use No   Alcohol/week: 0.0 standard drinks of alcohol   Social History   Substance and Sexual Activity  Drug Use No    Allergies: Allergies  Allergen Reactions   Imuran [Azathioprine] Swelling    Join pain , rash   Remicade [Infliximab] Other (See Comments)    Joint pain   Stelara [Ustekinumab] Hives    Patient was a new start to Stelara, rec'd the Stelara IV on 11/09/2017. He developed hives and was given 30 mg of Prednisone. Per patient after being given the Prednisone he noted additional hives,Stelara has been discontinued.   Ppd [Tuberculin Purified Protein Derivative] Hives and Swelling    Joint pain. Patient rec'd both this and Imuran, not sure which caused this.    Medications: Current Outpatient Medications  Medication Sig Dispense Refill   Ascorbic Acid (VITAMIN C) 500 MG CHEW Chew 500 mg by mouth daily.     cholecalciferol (VITAMIN D3) 25 MCG (1000 UNIT) tablet Take 2,000 Units by mouth in the morning and at bedtime.     ciprofloxacin (CIPRO) 500 MG tablet Take 1 tablet (500 mg total) by mouth 2 (two) times daily for 21 days. 42 tablet 0   melatonin 3 MG TABS tablet Take 3 mg by mouth at bedtime.     mesalamine (CANASA) 1000 MG suppository PLACE 1 SUPPOSITORY (1,000 MG TOTAL) RECTALLY AT BEDTIME. (Patient taking differently: Place 1,000 mg rectally 4 days.) 90 suppository 1   metroNIDAZOLE (FLAGYL) 500 MG tablet Take 1 tablet (500 mg total) by mouth 2 (two) times daily for 21 days. 42 tablet 0   Multiple  Vitamins-Minerals (PRESERVISION AREDS 2+MULTI VIT PO) Take 1 capsule by mouth in the morning and at bedtime.     nystatin-triamcinolone (MYCOLOG II) cream Apply 1 application topically 2 (two) times daily. (Patient taking differently: Apply 1 application  topically 2 (two) times daily as needed (irritation).) 30 g 1   OVER THE COUNTER MEDICATION Place 1 suppository rectally. Glutamax  high dose glutathione 350 mg glutayhione suppositories; every 4 days     Probiotic Product (PROBIOTIC DAILY PO) Take 1 capsule by mouth daily.     thiamine 100 MG tablet Take 100 mg by mouth daily.     OVER THE COUNTER MEDICATION Take 750 mg by mouth daily. Olive Leaf Extract 750 mg (Patient not taking: Reported on 08/29/2021)     No current facility-administered medications for this visit.   Facility-Administered Medications Ordered in Other Visits  Medication Dose Route Frequency Provider Last Rate Last Admin   gadobenate dimeglumine (MULTIHANCE) injection 20 mL  20 mL Intravenous Once PRN Cory [name], Cory Dawley, MD        Review of Systems: GENERAL: negative for malaise, night sweats HEENT: No changes in hearing or vision, no nose bleeds or other nasal  problems. NECK: Negative for lumps, goiter, pain and significant neck swelling RESPIRATORY: Negative for cough, wheezing CARDIOVASCULAR: Negative for chest pain, leg swelling, palpitations, orthopnea GI: SEE HPI MUSCULOSKELETAL: Negative for joint pain or swelling, back pain, and muscle pain. SKIN: Negative for lesions, rash PSYCH: Negative for sleep disturbance, mood disorder and recent psychosocial stressors. HEMATOLOGY Negative for prolonged bleeding, bruising easily, and swollen nodes. ENDOCRINE: Negative for cold or heat intolerance, polyuria, polydipsia and goiter. NEURO: negative for tremor, gait imbalance, syncope and seizures. The remainder of the review of systems is noncontributory.   Physical Exam: BP 118/76 (BP Location: Left Arm, Patient Position:  Sitting, Cuff Size: Large)   Pulse 80   Temp 98.1 F (36.7 C) (Oral)   Ht 6' 2"  (1.88 m)   Wt 221 lb 12.8 oz (100.6 kg)   BMI 28.48 kg/m  GENERAL: The patient is AO x3, in no acute distress. HEENT: Head is normocephalic and atraumatic. EOMI are intact. Mouth is well hydrated and without lesions. NECK: Supple. No masses LUNGS: Clear to auscultation. No presence of rhonchi/wheezing/rales. Adequate chest expansion HEART: RRR, normal s1 and s2. ABDOMEN: Soft, nontender, no guarding, no peritoneal signs, and nondistended. BS +. No masses. RECTAL EXAM: presence of mildly indurated fistulous tracts at 9 and 3 without active discharge, there was a third possible sinus tract in the intergluteal fold, normal tone, no masses, brown stool without blood.  Chaperone: Cory [name], CMA EXTREMITIES: Without any cyanosis, clubbing, rash, lesions or edema. NEUROLOGIC: AOx3, no focal motor deficit. SKIN: no jaundice, no rashes  Imaging/Labs: as above  I personally reviewed and interpreted the available labs, imaging and endoscopic files.  Impression and Plan: Cory [name] is a 61 y.o. male with PMH Crohn's disease with colonic CD with perianal disease, who presents for follow up of Crohn's disease.  The patient has presented a very complicated history of Crohn's disease affecting his colon and his perianal area.  He has been refractory to multiple treatments or has developed a reaction to them, which has precluded the use of these medicines.  He underwent diversion ileostomy placement which led to improvement of his symptoms transiently but he has presented intermittent drainage from his fistula.  He is currently on antibiotics for management of ongoing drainage which has significantly improved.  Notably, he reported having improvement with enemas including combination of 4 different agents but he stopped these medicines when he felt better and had recurrence of his symptoms shortly thereafter.  I advised  the patient to finish his antibiotic course.  He has continued the enemas and wants to keep taking his medicines even though he has to pay them out-of-pocket.  If he has no more drainage this would be an acceptable for his perianal disease.  I explained to him that the enemas may not have any effect on the rest of the colon which actually showed inflammation in the past.  However he had diversion ileostomy placed which may have decreased the Crohn's inflammation in the rest of the colon.  We had a lengthy discussion regarding the newer treatments available for Crohn's disease including Rinvoq and Skyrizi.  The patient stated that he would like to continue with the enemas for now but if he has recurrence of his disease even though he uses enemas, he will consider starting any of these other biologicals. I consider that we will need to discuss the possibility of repeating a colonoscopy in his next appointment given his longstanding disease and the risk of colorectal cancer,  as well as to assess if there is remission or improvement of his Crohn's disease with a diversion.  Ultimately, I explained to the patient and his wife that if he has recurrence of symptoms we will also need to proceed with an MR of the pelvis to determine if he has any complex fistula that may require placement of setons again.  -Finish antibiotic course - Continue mesalamine, Cory [name], glutamax and probyo max with butyrate enemas - If recurrence of symptoms, please reach [place] back for possible pelvic MRI - May discuss colonoscopy in next appointment if disease has improved in terms of drainage - RTC 2 months  All questions were answered.      Cory Quale, MD Gastroenterology and Hepatology [facility]

## 2021-09-12 ENCOUNTER — Ambulatory Visit (INDEPENDENT_AMBULATORY_CARE_PROVIDER_SITE_OTHER): Payer: Medicare Other | Admitting: Gastroenterology

## 2021-09-26 ENCOUNTER — Other Ambulatory Visit (INDEPENDENT_AMBULATORY_CARE_PROVIDER_SITE_OTHER): Payer: Self-pay | Admitting: Gastroenterology

## 2021-09-26 DIAGNOSIS — B839 Helminthiasis, unspecified: Secondary | ICD-10-CM

## 2021-09-26 NOTE — Progress Notes (Signed)
Wife called the office, states husband defecated some worms. She collected sample. Will order sample analysis.

## 2021-09-29 LAB — SPECIMEN STATUS REPORT

## 2021-09-30 LAB — PARASITE ID, WORM

## 2021-11-14 ENCOUNTER — Ambulatory Visit (INDEPENDENT_AMBULATORY_CARE_PROVIDER_SITE_OTHER): Payer: Medicare Other | Admitting: Gastroenterology

## 2021-11-14 ENCOUNTER — Encounter (INDEPENDENT_AMBULATORY_CARE_PROVIDER_SITE_OTHER): Payer: Self-pay | Admitting: Gastroenterology

## 2021-11-14 ENCOUNTER — Other Ambulatory Visit (INDEPENDENT_AMBULATORY_CARE_PROVIDER_SITE_OTHER): Payer: Self-pay

## 2021-11-14 VITALS — BP 124/79 | HR 68 | Temp 97.6°F | Ht 74.0 in | Wt 230.7 lb

## 2021-11-14 DIAGNOSIS — K50118 Crohn's disease of large intestine with other complication: Secondary | ICD-10-CM | POA: Diagnosis not present

## 2021-11-14 DIAGNOSIS — K50113 Crohn's disease of large intestine with fistula: Secondary | ICD-10-CM | POA: Diagnosis not present

## 2021-11-14 NOTE — Patient Instructions (Signed)
Schedule MRI pelvis Continue butyrate enemas daily Will discuss repeating colonoscopy in next appointment given active drainage from perianal area Obtain flu, shingles (Shingrix) and pneumonia vaccine

## 2021-11-14 NOTE — Progress Notes (Unsigned)
Cory [name], M.D. Gastroenterology & Hepatology [facility] [address] [place], [place] [address]  Primary Care Physician: Jacqualine Code, [place] [address]  I will communicate my assessment and recommendations to the referring MD via EMR.  Problems: Colonic Crohn's disease (multiple segments) with perianal disease Recurrent perianal abscesses  History of Present Illness: Cory [name] is a 61 y.o. male with PMH Crohn's disease with colonic CD with perianal disease, who presents for follow up of Crohn's disease.  The patient was last seen on 08/29/2021. At that time, the patient was advised to finish the antibiotic course for recurrent fistula drainage.  He was advised to continue on mesalamine and idalia, glutamax and probyo max with butyrate enemas (he has been using this for a while).  He reports having very scant drainage. Uses a pad and some days he does not even have any drainage. He denies any rectal bleeding or melena. Occasionally he may have some pain in his R side around the stoma area, especially when pulling up objects. The patient denies having any nausea, vomiting, fever, chills, hematochezia, melena, hematemesis, abdominal distention, abdominal pain, diarrhea, jaundice, pruritus . Has gained some weight since last time as he has been eating more than usual.  Notably, patient called to the office as he " defecated some parasites in the past".  Labs were ordered on 09/26/2021 but the analysis of the submitted material determined that the tissue was not parasitic.  Patient has tried infliximab, adalimumab, vedolizumab, ustekinumab, tofacitinib and certolizumab in the past.  Last Colonoscopy: 10/2015 - The examined portion of the ileum was normal. - Skip disease with extensive ulceration involving cecal mucosa, descending colon, sigmoid colon and rectum. - Perianal fistula found on perianal  exam. - Endoscopic findings consistent with fistula arising Crohn's disease involving colon and rectum.  Last flu shot:never Last pneumonia shot:never Last evaluation by dermatology: sees dermatology every 6 months Last zoster vaccine: never  Past Medical History: Past Medical History:  Diagnosis Date   Crohn's disease ([place])    History of degenerative disc disease     Past Surgical History: Past Surgical History:  Procedure Laterality Date   BIOPSY  10/28/2020   Procedure: BIOPSY;  Surgeon: Rogene Houston, MD;  [facility];  Service: Endoscopy;;   COLONOSCOPY  2013   COLONOSCOPY WITH PROPOFOL N/A 11/19/2015   Procedure: COLONOSCOPY WITH PROPOFOL;  Surgeon: Rogene Houston, MD;  [facility];  Service: Endoscopy;  Laterality: N/A;  random colon biopsy,  hepatic flexure polyp biopsy, sigmoid and rectal biopsy   ESOPHAGOGASTRODUODENOSCOPY (EGD) WITH PROPOFOL N/A 11/19/2015   Procedure: ESOPHAGOGASTRODUODENOSCOPY (EGD) WITH PROPOFOL;  Surgeon: Rogene Houston, MD;  [facility];  Service: Endoscopy;  Laterality: N/A;  biopsy duodenum, gastric biopsy   FLEXIBLE SIGMOIDOSCOPY  01/2012   FLEXIBLE SIGMOIDOSCOPY N/A 10/28/2020   Procedure: FLEXIBLE SIGMOIDOSCOPY;  Surgeon: Rogene Houston, MD;  [facility];  Service: Endoscopy;  Laterality: N/A;  12:50   HERNIA REPAIR     bilateral with mesh   ILEOSTOMY     Right thumb     Compound fracture   VASECTOMY      Family History: Family History  Problem Relation Age of Onset   Lymphoma Mother    Melanoma Father    Lung cancer Brother    Healthy Brother     Social History: Social History   Tobacco Use  Smoking Status Never  Passive exposure: Never  Smokeless Tobacco Never   Social History   Substance and Sexual Activity  Alcohol Use No   Alcohol/week: 0.0 standard drinks of alcohol   Social History   Substance and Sexual Activity  Drug Use No    Allergies: Allergies   Allergen Reactions   Imuran [Azathioprine] Swelling    Join pain , rash   Remicade [Infliximab] Other (See Comments)    Joint pain   Stelara [Ustekinumab] Hives    Patient was a new start to Stelara, rec'd the Stelara IV on 11/09/2017. He developed hives and was given 30 mg of Prednisone. Per patient after being given the Prednisone he noted additional hives,Stelara has been discontinued.   Ppd [Tuberculin Purified Protein Derivative] Hives and Swelling    Joint pain. Patient rec'd both this and Imuran, not sure which caused this.    Medications: Current Outpatient Medications  Medication Sig Dispense Refill   Ascorbic Acid (VITAMIN C) 500 MG CHEW Chew 500 mg by mouth daily.     cholecalciferol (VITAMIN D3) 25 MCG (1000 UNIT) tablet Take 2,000 Units by mouth in the morning and at bedtime.     melatonin 3 MG TABS tablet Take 3 mg by mouth at bedtime.     mesalamine (CANASA) 1000 MG suppository PLACE 1 SUPPOSITORY (1,000 MG TOTAL) RECTALLY AT BEDTIME. (Patient taking differently: Place 1,000 mg rectally 4 days.) 90 suppository 1   Multiple Vitamins-Minerals (PRESERVISION AREDS 2+MULTI VIT PO) Take 1 capsule by mouth in the morning and at bedtime.     nystatin-triamcinolone (MYCOLOG II) cream Apply 1 application topically 2 (two) times daily. (Patient taking differently: Apply 1 application  topically 2 (two) times daily as needed (irritation).) 30 g 1   OVER THE COUNTER MEDICATION Take 750 mg by mouth daily. Olive Leaf Extract 750 mg as needed.     OVER THE COUNTER MEDICATION Place 1 suppository rectally. Glutamax  high dose glutathione 350 mg glutayhione suppositories; every 4 days     Probiotic Product (PROBIOTIC DAILY PO) Take 1 capsule by mouth daily.     thiamine 100 MG tablet Take 100 mg by mouth daily.     No current facility-administered medications for this visit.   Facility-Administered Medications Ordered in Other Visits  Medication Dose Route Frequency Provider Last Rate Last  Admin   gadobenate dimeglumine (MULTIHANCE) injection 20 mL  20 mL Intravenous Once PRN Rehman, Mechele Dawley, MD        Review of Systems: GENERAL: negative for malaise, night sweats HEENT: No changes in hearing or vision, no nose bleeds or other nasal problems. NECK: Negative for lumps, goiter, pain and significant neck swelling RESPIRATORY: Negative for cough, wheezing CARDIOVASCULAR: Negative for chest pain, leg swelling, palpitations, orthopnea GI: SEE HPI MUSCULOSKELETAL: Negative for joint pain or swelling, back pain, and muscle pain. SKIN: Negative for lesions, rash PSYCH: Negative for sleep disturbance, mood disorder and recent psychosocial stressors. HEMATOLOGY Negative for prolonged bleeding, bruising easily, and swollen nodes. ENDOCRINE: Negative for cold or heat intolerance, polyuria, polydipsia and goiter. NEURO: negative for tremor, gait imbalance, syncope and seizures. The remainder of the review of systems is noncontributory.   Physical Exam: BP 124/79 (BP Location: Left Arm, Patient Position: Sitting, Cuff Size: Large)   Pulse 68   Temp 97.6 F (36.4 C) (Oral)   Ht 6' 2"  (1.88 m)   Wt 230 lb 11.2 oz (104.6 kg)   BMI 29.62 kg/m  GENERAL: The patient is AO x3, in no acute  distress. HEENT: Head is normocephalic and atraumatic. EOMI are intact. Mouth is well hydrated and without lesions. NECK: Supple. No masses LUNGS: Clear to auscultation. No presence of rhonchi/wheezing/rales. Adequate chest expansion HEART: RRR, normal s1 and s2. ABDOMEN: Soft, nontender, no guarding, no peritoneal signs, and nondistended. BS +. No masses. RECTAL EXAM: presence of a perianal fistula on the R gluteal area with scan whitish secretion, also presence of fluctuance in the L gluteal area without active drainage, normal tone, no masses, brown stool without blood EXTREMITIES: Without any cyanosis, clubbing, rash, lesions or edema. NEUROLOGIC: AOx3, no focal motor deficit. SKIN: no jaundice,  no rashes  Imaging/Labs: as above  I personally reviewed and interpreted the available labs, imaging and endoscopic files.  Impression and Plan: Cory [name] is a 61 y.o. male with PMH Crohn's disease with colonic CD with perianal disease, who presents for follow up of Crohn's disease.  The patient does not offer any complaints at the moment but on physical exam he was found to have presence of active drainage and fluctuance in a second region in his anal area concerning for recurrent abscesses.  I discussed with the patient and his wife that he is not ideal to do any endoscopic evaluation at this moment until this has been further evaluated.  We will proceed with an MRI of the pelvis to evaluate the presence of abscesses for now.  We discussed the possibility of trying other biologicals such as Rinvoq or Skyrizi in the future.  -Schedule MRI pelvis -Continue idalia, glutamax and probyo max with butyrate enemas  -Will discuss repeating colonoscopy in next appointment given active drainage from perianal area -Obtain flu, shingles (Shingrix) and pneumonia vaccine  All questions were answered.      Cory Peppers, MD Gastroenterology and Hepatology [facility]

## 2021-12-12 ENCOUNTER — Other Ambulatory Visit (HOSPITAL_COMMUNITY): Payer: Medicare Other

## 2021-12-12 ENCOUNTER — Ambulatory Visit (HOSPITAL_COMMUNITY)
Admission: RE | Admit: 2021-12-12 | Discharge: 2021-12-12 | Disposition: A | Discharge status: Home / Self Care | Payer: Medicare Other | Source: Ambulatory Visit | Attending: Gastroenterology | Admitting: Gastroenterology

## 2021-12-12 DIAGNOSIS — K50113 Crohn's disease of large intestine with fistula: Secondary | ICD-10-CM | POA: Diagnosis present

## 2021-12-12 MED ORDER — GADOBUTROL 1 MMOL/ML IV SOLN
10.0000 mL | Freq: Once | INTRAVENOUS | Status: AC | PRN
  Administered 2021-12-12: 10 mL via INTRAVENOUS

## 2021-12-20 ENCOUNTER — Other Ambulatory Visit (INDEPENDENT_AMBULATORY_CARE_PROVIDER_SITE_OTHER): Payer: Self-pay | Admitting: Gastroenterology

## 2021-12-20 DIAGNOSIS — K50113 Crohn's disease of large intestine with fistula: Secondary | ICD-10-CM

## 2021-12-20 MED ORDER — CIPROFLOXACIN HCL 500 MG PO TABS: 500.0000 mg | ORAL_TABLET | Freq: Two times a day (BID) | ORAL | 0 refills | Status: AC

## 2021-12-20 MED ORDER — METRONIDAZOLE 500 MG PO TABS: 500.0000 mg | ORAL_TABLET | Freq: Two times a day (BID) | ORAL | 0 refills | Status: AC

## 2021-12-21 ENCOUNTER — Encounter (INDEPENDENT_AMBULATORY_CARE_PROVIDER_SITE_OTHER): Payer: Self-pay

## 2022-02-27 ENCOUNTER — Ambulatory Visit (INDEPENDENT_AMBULATORY_CARE_PROVIDER_SITE_OTHER): Payer: Medicare Other | Admitting: Gastroenterology

## 2022-02-27 ENCOUNTER — Encounter (INDEPENDENT_AMBULATORY_CARE_PROVIDER_SITE_OTHER): Payer: Self-pay | Admitting: Gastroenterology

## 2022-02-27 VITALS — BP 117/68 | HR 69 | Temp 97.8°F | Ht 74.0 in | Wt 228.9 lb

## 2022-02-27 DIAGNOSIS — K50113 Crohn's disease of large intestine with fistula: Secondary | ICD-10-CM | POA: Diagnosis not present

## 2022-02-27 DIAGNOSIS — K50118 Crohn's disease of large intestine with other complication: Secondary | ICD-10-CM

## 2022-02-27 NOTE — Progress Notes (Signed)
Katrinka Blazing, M.D. Gastroenterology & Hepatology [facility] Gastroenterology [address] [place], [place] [address]  Primary Care Physician: Lennox Pippins, NP [address] [place] [address]  I will communicate my assessment and recommendations to the referring MD via EMR.  Problems: Colonic Crohn's disease (multiple segments) with perianal disease Recurrent perianal abscesses Complex perianal fistulae  History of Present Illness: Shawnmichael Parenteau is a 62 y.o.  male with PMH Crohn's disease with colonic CD with perianal disease, who presents for follow up of Crohn's disease.   The patient was last seen on 11/14/2021. At that time, the patient was scheduled to undergo an MRI which he underwent on 12/12/2021, which showed a transsphincteric fistula in the posterior margin of the upper and lower sphincter with draining component in the right mid gluteal cleft and a upper component extending to the posterior margin of the right lateral margin of the sacrum and coccyx, no focal fluid collection other than the fluid from the tract.  There was moral stratification of the distal colon and rectum without perirectal stranding (similar to previous imaging).  Given these findings, patient was given a 1 month course of ciprofloxacin and Flagyl and was referred for evaluation by general surgery at [facility].  Was seen on 01/19/2022 by Dr. Durenda Hurt.  At that time he did not have any more secretions and was presenting significant improvement of his drainage so no further interventions were performed.  Patient reports feeling well and denies any complaints. States his rectal drainage resolved a week after he started antibiotics and has not recurred since then.  No abdominal pain, rectal pain or fluctuation in his perianal area.  Currently he is doing Aloe vera, silver and slippery elm enema on a daily basis. On average empties his bag around 5 times  a day. Stool can be formed when he eats starchy foods but otherwise it is mostly watery.  The patient denies having any nausea, vomiting, fever, chills, hematochezia, melena, hematemesis, abdominal distention, abdominal pain, diarrhea, jaundice, pruritus or weight loss.  Patient has tried infliximab, adalimumab, vedolizumab, ustekinumab, tofacitinib and certolizumab in the past.  All these treatments have failed.  Last Colonoscopy: 10/2015 - The examined portion of the ileum was normal. - Skip disease with extensive ulceration involving cecal mucosa, descending colon, sigmoid colon and rectum. 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 - Endoscopic findings consistent with fistula arising Crohn's disease involving colon and rectum.   Last flu shot:never Last pneumonia shot:never Last evaluation by dermatology: sees dermatology every 6 months Last zoster vaccine: never  Past Medical History: Past Medical History:  Diagnosis Date   Crohn's disease (HCC)    History of degenerative disc disease     Past Surgical History: Past Surgical History:  Procedure Laterality Date   BIOPSY  10/28/2020   Procedure: BIOPSY;  Surgeon: Malissa Hippo, MD;  [facility];  Service: Endoscopy;;   COLONOSCOPY  2013   COLONOSCOPY WITH PROPOFOL N/A 11/19/2015   Procedure: COLONOSCOPY WITH PROPOFOL;  Surgeon: Malissa Hippo, MD;  [facility];  Service: Endoscopy;  Laterality: N/A;  random colon biopsy,  hepatic flexure polyp biopsy, sigmoid and rectal biopsy   ESOPHAGOGASTRODUODENOSCOPY (EGD) WITH PROPOFOL N/A 11/19/2015   Procedure: ESOPHAGOGASTRODUODENOSCOPY (EGD) WITH PROPOFOL;  Surgeon: Malissa Hippo, MD;  [facility];  Service: Endoscopy;  Laterality: N/A;  biopsy duodenum, gastric biopsy   FLEXIBLE SIGMOIDOSCOPY  01/2012   FLEXIBLE SIGMOIDOSCOPY  N/A 10/28/2020   Procedure: FLEXIBLE SIGMOIDOSCOPY;  Surgeon: Rogene Houston, MD;  [facility];  Service:  Endoscopy;  Laterality: N/A;  12:50   HERNIA REPAIR     bilateral with mesh   ILEOSTOMY     Right thumb     Compound fracture   VASECTOMY      Family History: Family History  Problem Relation Age of Onset   Lymphoma Mother    Melanoma Father    Lung cancer Brother    Healthy Brother     Social History: Social History   Tobacco Use  Smoking Status Never   Passive exposure: Never  Smokeless Tobacco Never   Social History   Substance and Sexual Activity  Alcohol Use No   Alcohol/week: 0.0 standard drinks of alcohol   Social History   Substance and Sexual Activity  Drug Use No    Allergies: Allergies  Allergen Reactions   Imuran [Azathioprine] Swelling    Join pain , rash   Remicade [Infliximab] Other (See Comments)    Joint pain   Stelara [Ustekinumab] Hives    Patient was a new start to Stelara, rec'd the Stelara IV on 11/09/2017. He developed hives and was given 30 mg of Prednisone. Per patient after being given the Prednisone he noted additional hives,Stelara has been discontinued.   Ppd [Tuberculin Purified Protein Derivative] Hives and Swelling    Joint pain. Patient rec'd both this and Imuran, not sure which caused this.    Medications: Current Outpatient Medications  Medication Sig Dispense Refill   Ascorbic Acid (VITAMIN C) 500 MG CHEW Chew 500 mg by mouth daily.     cholecalciferol (VITAMIN D3) 25 MCG (1000 UNIT) tablet Take 2,000 Units by mouth in the morning and at bedtime.     melatonin 3 MG TABS tablet Take 3 mg by mouth at bedtime.     mesalamine (CANASA) 1000 MG suppository PLACE 1 SUPPOSITORY (1,000 MG TOTAL) RECTALLY AT BEDTIME. (Patient taking differently: Place 1,000 mg rectally 4 days.) 90 suppository 1   nystatin-triamcinolone (MYCOLOG II) cream Apply 1 application topically 2 (two) times daily. (Patient taking differently: Apply 1 application  topically 2 (two) times daily as needed (irritation).) 30 g 1   OVER THE COUNTER MEDICATION Take  750 mg by mouth daily. Olive Leaf Extract 750 mg as needed.     OVER THE COUNTER MEDICATION Place 1 suppository rectally. Glutamax  high dose glutathione 350 mg glutayhione suppositories; every 4 days     OVER THE COUNTER MEDICATION Probiotic suppository prn     Probiotic Product (PROBIOTIC DAILY PO) Take 1 capsule by mouth daily.     rosuvastatin (CRESTOR) 5 MG tablet Take 10 mg by mouth every other day.     thiamine 100 MG tablet Take 100 mg by mouth daily. (Patient not taking: Reported on 02/27/2022)     No current facility-administered medications for this visit.   Facility-Administered Medications Ordered in Other Visits  Medication Dose Route Frequency Provider Last Rate Last Admin   gadobenate dimeglumine (MULTIHANCE) injection 20 mL  20 mL Intravenous Once PRN Rehman, Mechele Dawley, MD        Review of Systems: GENERAL: negative for malaise, night sweats HEENT: No changes in hearing or vision, no nose bleeds or other nasal problems. NECK: Negative for lumps, goiter, pain and significant neck swelling RESPIRATORY: Negative for cough, wheezing CARDIOVASCULAR: Negative for chest pain, leg swelling, palpitations, orthopnea GI: SEE HPI MUSCULOSKELETAL: Negative for joint pain or  swelling, back pain, and muscle pain. SKIN: Negative for lesions, rash PSYCH: Negative for sleep disturbance, mood disorder and recent psychosocial stressors. HEMATOLOGY Negative for prolonged bleeding, bruising easily, and swollen nodes. ENDOCRINE: Negative for cold or heat intolerance, polyuria, polydipsia and goiter. NEURO: negative for tremor, gait imbalance, syncope and seizures. The remainder of the review of systems is noncontributory.   Physical Exam: BP 117/68 (BP Location: Left Arm, Patient Position: Sitting, Cuff Size: Large)   Pulse 69   Temp 97.8 F (36.6 C) (Temporal)   Ht 6\' 2"  (1.88 m)   Wt 228 lb 14.4 oz (103.8 kg)   BMI 29.39 kg/m  GENERAL: The patient is AO x3, in no acute  distress. HEENT: Head is normocephalic and atraumatic. EOMI are intact. Mouth is well hydrated and without lesions. NECK: Supple. No masses LUNGS: Clear to auscultation. No presence of rhonchi/wheezing/rales. Adequate chest expansion HEART: RRR, normal s1 and s2. ABDOMEN: Soft, nontender, no guarding, no peritoneal signs, and nondistended. BS +. No masses.  Has an ostomy bag RECTAL EXAM: presence of multiple scar in the perianal area without active drainage or drainage orifice - looked healed, no other external lesions. EXTREMITIES: Without any cyanosis, clubbing, rash, lesions or edema. NEUROLOGIC: AOx3, no focal motor deficit. SKIN: no jaundice, no rashes  Imaging/Labs: as above  I personally reviewed and interpreted the available labs, imaging and endoscopic files.  Impression and Plan: Sergey Ishler is a 62 y.o.  male with PMH Crohn's disease with colonic CD with perianal disease, who presents for follow up of Crohn's disease.  The patient has had a longstanding history of complex perianal Crohn's disease.  He has been on multiple medications for this disorder which have failed to control his disease.  Due to this he underwent a diversion ileostomy which has partially controlled his disease.  However, in his last appointment he had recurrent drainage and was found to have complex perianal enlarging fistulae which were getting in proximity to the coccyx.  Fortunately, this fistulae have clinically improved with the use of antibiotics and he is not currently having any drainage.  We had a very extensive discussion regarding the possibility that he may still have fistulae present in his perianal area that have not exteriorized.  We discussed that it is very likely that given his history this may clinically recur in the future.  I expressed my concern that the fistula were getting closer to the spine and this may a bad outcome in the future such as osteomyelitis.  Therefore, we discussed the  importance of trying other alternatives for treatment besides the homeopathic enemas they have tried in the past as this has clearly not controlled their disease.  We discussed the possibility of trying newer medications such as Rinvoq or Skyrizi -I informed both the patient and wife that the current data favored Rinvoq as there was some evidence of benefit for fistulizing disease.  However they expressed their concerns about the potential side effects of the medication and the fact that the patient did not want to receive any vaccines (shingles vaccine is a must for Rinvoq).  They expressed that they wanted to explore other options such as surgical approach with total colectomy with proctectomy and end ileostomy which they would like to discuss with Dr. Morton Stall.  I find this reasonable -the patient will give me a call back if this is not an option and will be open to proceed with Skyrizi.  - Discuss with Dr. Morton Stall possibility of colectomy with  proctectomy and end ileostomy - If surgical management is not an option, can call back to start process to authorize Skyrizi per your preference - RTC 4 months  All questions were answered.      Katrinka Blazing, MD Gastroenterology and Hepatology [facility] Gastroenterology

## 2022-02-27 NOTE — Patient Instructions (Signed)
Discuss with Dr. Morton Stall possibility of colectomy with proctectomy and end ileostomy If surgical management is not an option, can call back to start process to authorize Skyrizi per your preference

## 2022-06-19 ENCOUNTER — Telehealth (INDEPENDENT_AMBULATORY_CARE_PROVIDER_SITE_OTHER): Payer: Self-pay | Admitting: Gastroenterology

## 2022-06-19 ENCOUNTER — Ambulatory Visit (INDEPENDENT_AMBULATORY_CARE_PROVIDER_SITE_OTHER): Payer: Medicare Other | Admitting: Gastroenterology

## 2022-06-19 ENCOUNTER — Encounter (INDEPENDENT_AMBULATORY_CARE_PROVIDER_SITE_OTHER): Payer: Self-pay | Admitting: Gastroenterology

## 2022-06-19 VITALS — BP 121/82 | HR 71 | Temp 97.3°F | Ht 74.0 in | Wt 236.1 lb

## 2022-06-19 DIAGNOSIS — K9089 Other intestinal malabsorption: Secondary | ICD-10-CM

## 2022-06-19 DIAGNOSIS — K50113 Crohn's disease of large intestine with fistula: Secondary | ICD-10-CM

## 2022-06-19 DIAGNOSIS — K50118 Crohn's disease of large intestine with other complication: Secondary | ICD-10-CM | POA: Diagnosis not present

## 2022-06-19 DIAGNOSIS — Z1321 Encounter for screening for nutritional disorder: Secondary | ICD-10-CM | POA: Diagnosis not present

## 2022-06-19 NOTE — Patient Instructions (Signed)
Schedule colonoscopy Perform blood workup If interested in starting other medication such as Skyrizi or Rinvoq, please update [place] about this.

## 2022-06-19 NOTE — Telephone Encounter (Signed)
Pt scheduled for TCS 07/19/22. Per Dr.Castaneda-only enema for prep. Flex sig instructions(since they have only enemas) mailed to pt.

## 2022-06-19 NOTE — Progress Notes (Unsigned)
Katrinka Blazing, M.D. Gastroenterology & Hepatology [facility] Gastroenterology [address] [place], [place] [address]  Primary Care Physician: Lennox Pippins, NP [address] [place] [address]  I will communicate my assessment and recommendations to the referring MD via EMR.  Problems: Colonic Crohn's disease (multiple segments) with perianal disease Recurrent perianal abscesses Complex perianal fistulae   History of Present Illness: Loris Nocella is a 62 y.o.  male with PMH Crohn's disease with colonic CD with perianal disease, who presents for follow up of Crohn's disease.   The patient was last seen on 02/27/2022. At that time, the patient stated that he wanted to discuss with Dr. Byrd Hesselbach the possibility of colectomy with proctectomy and end ileostomy.    The patient saw Dr. Byrd Hesselbach as a virtual visit on 03/21/2022 and stated he wanted to manage his disease expectantly at the time given the potential cons of undergoing a colectomy.  He denies having any complaints. The patient denies having any nausea, vomiting, fever, chills, hematochezia, melena, hematemesis, abdominal distention, abdominal pain, diarrhea, jaundice, pruritus. Has gained 8 lb since the last time seen in clinic. He has to empty his bag 5-6 times per day.  He is currently doing a home made mix of slippery elm, aloe gel, silver and garlic enemas every night.  MRI on 12/12/2021 showed a transsphincteric fistula in the posterior margin of the upper and lower sphincter with draining component in the right mid gluteal cleft and a upper component extending to the posterior margin of the right lateral margin of the sacrum and coccyx, no focal fluid collection other than the fluid from the tract.  There was moral stratification of the distal colon and rectum without perirectal stranding (similar to previous imaging).   No EIM.  Last flu shot:never Last pneumonia shot:never Last  evaluation by dermatology: sees dermatology every 6 months Last zoster vaccine: never  Last Colonoscopy: 10/2015 - The examined portion of the ileum was normal. - Skip disease with extensive ulceration involving cecal mucosa, descending colon, sigmoid colon and rectum. 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 - Endoscopic findings consistent with fistula arising Crohn's disease involving colon and rectum.   Last flu shot:never Last pneumonia shot:never Last evaluation by dermatology: sees dermatology every 6 months Last zoster vaccine: never  Past Medical History: Past Medical History:  Diagnosis Date   Crohn's disease (HCC)    History of degenerative disc disease     Past Surgical History: Past Surgical History:  Procedure Laterality Date   BIOPSY  10/28/2020   Procedure: BIOPSY;  Surgeon: Malissa Hippo, MD;  [facility];  Service: Endoscopy;;   COLONOSCOPY  2013   COLONOSCOPY WITH PROPOFOL N/A 11/19/2015   Procedure: COLONOSCOPY WITH PROPOFOL;  Surgeon: Malissa Hippo, MD;  [facility];  Service: Endoscopy;  Laterality: N/A;  random colon biopsy,  hepatic flexure polyp biopsy, sigmoid and rectal biopsy   ESOPHAGOGASTRODUODENOSCOPY (EGD) WITH PROPOFOL N/A 11/19/2015   Procedure: ESOPHAGOGASTRODUODENOSCOPY (EGD) WITH PROPOFOL;  Surgeon: Malissa Hippo, MD;  [facility];  Service: Endoscopy;  Laterality: N/A;  biopsy duodenum, gastric biopsy   FLEXIBLE SIGMOIDOSCOPY  01/2012   FLEXIBLE SIGMOIDOSCOPY N/A 10/28/2020   Procedure: FLEXIBLE SIGMOIDOSCOPY;  Surgeon: Malissa Hippo, MD;  [facility];  Service: Endoscopy;  Laterality: N/A;  12:50   HERNIA REPAIR     bilateral with mesh   ILEOSTOMY     Right thumb  Compound fracture   VASECTOMY      Family History: Family History  Problem Relation Age of Onset   Lymphoma Mother    Melanoma Father    Lung cancer Brother    Healthy Brother     Social History: Social  History   Tobacco Use  Smoking Status Never   Passive exposure: Never  Smokeless Tobacco Never   Social History   Substance and Sexual Activity  Alcohol Use No   Alcohol/week: 0.0 standard drinks of alcohol   Social History   Substance and Sexual Activity  Drug Use No    Allergies: Allergies  Allergen Reactions   Imuran [Azathioprine] Swelling    Join pain , rash   Remicade [Infliximab] Other (See Comments)    Joint pain   Stelara [Ustekinumab (Murine)] Hives    Patient was a new start to Stelara, rec'd the Stelara IV on 11/09/2017. He developed hives and was given 30 mg of Prednisone. Per patient after being given the Prednisone he noted additional hives,Stelara has been discontinued.   Ppd [Tuberculin Purified Protein Derivative] Hives and Swelling    Joint pain. Patient rec'd both this and Imuran, not sure which caused this.    Medications: Current Outpatient Medications  Medication Sig Dispense Refill   Ascorbic Acid (VITAMIN C) 500 MG CHEW Chew 500 mg by mouth daily.     cholecalciferol (VITAMIN D3) 25 MCG (1000 UNIT) tablet Take 2,000 Units by mouth in the morning and at bedtime.     melatonin 3 MG TABS tablet Take 3 mg by mouth at bedtime.     nystatin-triamcinolone (MYCOLOG II) cream Apply 1 application topically 2 (two) times daily. (Patient taking differently: Apply 1 application  topically 2 (two) times daily as needed (irritation).) 30 g 1   OVER THE COUNTER MEDICATION Place 1 suppository rectally. Glutamax  high dose glutathione 350 mg glutayhione suppositories; every 4 days     OVER THE COUNTER MEDICATION Probiotic suppository prn     Probiotic Product (PROBIOTIC DAILY PO) Take 1 capsule by mouth daily.     rosuvastatin (CRESTOR) 5 MG tablet Take 10 mg by mouth every other day.     thiamine 100 MG tablet Take 100 mg by mouth daily.     mesalamine (CANASA) 1000 MG suppository PLACE 1 SUPPOSITORY (1,000 MG TOTAL) RECTALLY AT BEDTIME. (Patient not taking:  Reported on 06/19/2022) 90 suppository 1   No current facility-administered medications for this visit.   Facility-Administered Medications Ordered in Other Visits  Medication Dose Route Frequency Provider Last Rate Last Admin   gadobenate dimeglumine (MULTIHANCE) injection 20 mL  20 mL Intravenous Once PRN Rehman, Joline Maxcy, MD        Review of Systems: GENERAL: negative for malaise, night sweats HEENT: No changes in hearing or vision, no nose bleeds or other nasal problems. NECK: Negative for lumps, goiter, pain and significant neck swelling RESPIRATORY: Negative for cough, wheezing CARDIOVASCULAR: Negative for chest pain, leg swelling, palpitations, orthopnea GI: SEE HPI MUSCULOSKELETAL: Negative for joint pain or swelling, back pain, and muscle pain. SKIN: Negative for lesions, rash PSYCH: Negative for sleep disturbance, mood disorder and recent psychosocial stressors. HEMATOLOGY Negative for prolonged bleeding, bruising easily, and swollen nodes. ENDOCRINE: Negative for cold or heat intolerance, polyuria, polydipsia and goiter. NEURO: negative for tremor, gait imbalance, syncope and seizures. The remainder of the review of systems is noncontributory.   Physical Exam: BP 121/82 (BP Location: Left Arm, Patient Position: Sitting, Cuff Size: Large)  Pulse 71   Temp (!) 97.3 F (36.3 C) (Temporal)   Ht 6\' 2"  (1.88 m)   Wt 236 lb 1.6 oz (107.1 kg)   BMI 30.31 kg/m  GENERAL: The patient is AO x3, in no acute distress. HEENT: Head is normocephalic and atraumatic. EOMI are intact. Mouth is well hydrated and without lesions. NECK: Supple. No masses LUNGS: Clear to auscultation. No presence of rhonchi/wheezing/rales. Adequate chest expansion HEART: RRR, normal s1 and s2. ABDOMEN: Soft, nontender, no guarding, no peritoneal signs, and nondistended. BS +. No masses. RECTAL EXAM: no external lesions, normal tone, no masses, brown stool without blood.*** Chaperone: EXTREMITIES: Without  any cyanosis, clubbing, rash, lesions or edema. NEUROLOGIC: AOx3, no focal motor deficit. SKIN: no jaundice, no rashes  Imaging/Labs: as above  I personally reviewed and interpreted the available labs, imaging and endoscopic files.  Impression and Plan: Artie Heekin is a 62 y.o. male coming for follow up of ***   All questions were answered.      Katrinka Blazing, MD Gastroenterology and Hepatology [facility] Gastroenterology

## 2022-06-20 ENCOUNTER — Telehealth (INDEPENDENT_AMBULATORY_CARE_PROVIDER_SITE_OTHER): Payer: Self-pay | Admitting: Gastroenterology

## 2022-06-20 LAB — COMPREHENSIVE METABOLIC PANEL
ALT: 22 IU/L (ref 0–44)
AST: 20 IU/L (ref 0–40)
Albumin/Globulin Ratio: 1.4 (ref 1.2–2.2)
Albumin: 4.5 g/dL (ref 3.9–4.9)
Alkaline Phosphatase: 93 IU/L (ref 44–121)
BUN/Creatinine Ratio: 10 (ref 10–24)
BUN: 12 mg/dL (ref 8–27)
Bilirubin Total: 0.8 mg/dL (ref 0.0–1.2)
CO2: 21 mmol/L (ref 20–29)
Calcium: 9.7 mg/dL (ref 8.6–10.2)
Chloride: 104 mmol/L (ref 96–106)
Creatinine, Ser: 1.21 mg/dL (ref 0.76–1.27)
Globulin, Total: 3.3 g/dL (ref 1.5–4.5)
Glucose: 78 mg/dL (ref 70–99)
Potassium: 4.6 mmol/L (ref 3.5–5.2)
Sodium: 141 mmol/L (ref 134–144)
Total Protein: 7.8 g/dL (ref 6.0–8.5)
eGFR: 68 mL/min/{1.73_m2} (ref >59)

## 2022-06-20 LAB — CBC WITH DIFFERENTIAL/PLATELET
Basophils Absolute: 0.1 10*3/uL (ref 0.0–0.2)
Basos: 1 %
EOS (ABSOLUTE): 0.3 10*3/uL (ref 0.0–0.4)
Eos: 4 %
Hematocrit: 45.2 % (ref 37.5–51.0)
Hemoglobin: 15 g/dL (ref 13.0–17.7)
Immature Grans (Abs): 0 10*3/uL (ref 0.0–0.1)
Immature Granulocytes: 0 %
Lymphocytes Absolute: 1.8 10*3/uL (ref 0.7–3.1)
Lymphs: 25 %
MCH: 29 pg (ref 26.6–33.0)
MCHC: 33.2 g/dL (ref 31.5–35.7)
MCV: 87 fL (ref 79–97)
Monocytes Absolute: 0.7 10*3/uL (ref 0.1–0.9)
Monocytes: 9 %
Neutrophils Absolute: 4.3 10*3/uL (ref 1.4–7.0)
Neutrophils: 61 %
Platelets: 268 10*3/uL (ref 150–450)
RBC: 5.17 x10E6/uL (ref 4.14–5.80)
RDW: 13.5 % (ref 11.6–15.4)
WBC: 7.1 10*3/uL (ref 3.4–10.8)

## 2022-06-20 LAB — VITAMIN D 25 HYDROXY (VIT D DEFICIENCY, FRACTURES): Vit D, 25-Hydroxy: 87.4 ng/mL (ref 30.0–100.0)

## 2022-06-20 LAB — IRON,TIBC AND FERRITIN PANEL
Ferritin: 67 ng/mL (ref 30–400)
Iron Saturation: 37 % (ref 15–55)
Iron: 121 ug/dL (ref 38–169)
Total Iron Binding Capacity: 325 ug/dL (ref 250–450)
UIBC: 204 ug/dL (ref 111–343)

## 2022-06-20 LAB — SPECIMEN STATUS REPORT

## 2022-06-20 LAB — C-REACTIVE PROTEIN: CRP: 2 mg/L (ref 0–10)

## 2022-06-20 NOTE — Telephone Encounter (Signed)
Pt called in and needed to reschedule TCS -originally scheduled for 07/19/22. Pt rescheduled to 07/26/22 at 1:30. Will send updated instructions.

## 2022-07-26 ENCOUNTER — Telehealth (INDEPENDENT_AMBULATORY_CARE_PROVIDER_SITE_OTHER): Payer: Self-pay | Admitting: Gastroenterology

## 2022-07-26 ENCOUNTER — Ambulatory Visit (HOSPITAL_COMMUNITY): Payer: Medicare Other | Admitting: Certified Registered Nurse Anesthetist

## 2022-07-26 ENCOUNTER — Ambulatory Visit (HOSPITAL_COMMUNITY)
Admission: RE | Admit: 2022-07-26 | Discharge: 2022-07-26 | Disposition: A | Discharge status: Home / Self Care | Payer: Medicare Other | Attending: Gastroenterology | Admitting: Gastroenterology

## 2022-07-26 ENCOUNTER — Other Ambulatory Visit: Payer: Self-pay

## 2022-07-26 ENCOUNTER — Encounter (HOSPITAL_COMMUNITY)
Admission: RE | Disposition: A | Discharge status: Home / Self Care | Payer: Self-pay | Source: Home / Self Care | Attending: Gastroenterology

## 2022-07-26 ENCOUNTER — Encounter (HOSPITAL_COMMUNITY): Payer: Self-pay | Admitting: Gastroenterology

## 2022-07-26 DIAGNOSIS — Z932 Ileostomy status: Secondary | ICD-10-CM | POA: Diagnosis not present

## 2022-07-26 DIAGNOSIS — D649 Anemia, unspecified: Secondary | ICD-10-CM | POA: Diagnosis not present

## 2022-07-26 DIAGNOSIS — K50112 Crohn's disease of large intestine with intestinal obstruction: Secondary | ICD-10-CM | POA: Insufficient documentation

## 2022-07-26 DIAGNOSIS — K624 Stenosis of anus and rectum: Secondary | ICD-10-CM

## 2022-07-26 DIAGNOSIS — K50113 Crohn's disease of large intestine with fistula: Secondary | ICD-10-CM | POA: Diagnosis not present

## 2022-07-26 HISTORY — PX: FLEXIBLE SIGMOIDOSCOPY: SHX5431

## 2022-07-26 SURGERY — SIGMOIDOSCOPY, FLEXIBLE
Anesthesia: General

## 2022-07-26 MED ORDER — LACTATED RINGERS IV SOLN
INTRAVENOUS | Status: DC
  Administered 2022-07-26: 1000 mL via INTRAVENOUS

## 2022-07-26 MED ORDER — PROPOFOL 500 MG/50ML IV EMUL
INTRAVENOUS | Status: DC | PRN
  Administered 2022-07-26: 150 ug/kg/min via INTRAVENOUS

## 2022-07-26 MED ORDER — PROPOFOL 500 MG/50ML IV EMUL
INTRAVENOUS | Status: AC
  Filled 2022-07-26: qty 50

## 2022-07-26 MED ORDER — PROPOFOL 10 MG/ML IV BOLUS
INTRAVENOUS | Status: DC | PRN
  Administered 2022-07-26: 60 mg via INTRAVENOUS

## 2022-07-26 MED ORDER — LACTATED RINGERS IV SOLN: INTRAVENOUS | Status: DC

## 2022-07-26 NOTE — Telephone Encounter (Signed)
Referral and notes faxed to [facility], they will contact patient with apt °

## 2022-07-26 NOTE — Discharge Instructions (Signed)
You are being discharged to home.  Resume your previous diet.  

## 2022-07-26 NOTE — Op Note (Signed)
[facility] Patient Name: Cory [name] Procedure Date: 07/26/2022 12:23 PM MRN: [id number] Date of Birth: April 07, 1960 Attending MD: Katrinka Blazing , , [id number] CSN: [id number] Age: 62 Admit Type: Outpatient Procedure:                Flexible Sigmoidoscopy Indications:              Follow-up of Crohn's disease of the colon, history                            of diverting ileostomy Providers:                Katrinka Blazing, Jannett Celestine, RN, Volanda Napoleon., Technician Referring MD:              Medicines:                Monitored Anesthesia Care Complications:            No immediate complications. Estimated Blood Loss:     Estimated blood loss: none. Procedure:                Pre-Anesthesia Assessment:                           - Prior to the procedure, a History and Physical                            was performed, and patient medications, allergies                            and sensitivities were reviewed. The patient's                            tolerance of previous anesthesia was reviewed.                           - The risks and benefits of the procedure and the                            sedation options and risks were discussed with the                            patient. All questions were answered and informed                            consent was obtained.                           - ASA Grade Assessment: III - A patient with severe                            systemic disease.                           The PCF-HQ190L ([id number] scope was introduced  through the anus and advanced to the 20 cm from the                            anal verge. The [id number] scope was                            introduced through the anus and advanced to the 30                            cm from the anal verge. The flexible sigmoidoscopy                            was performed with difficulty due to severe                             stricturing. The patient tolerated the procedure                            well. Scope In: 1:00:52 PM Scope Out: 1:09:59 PM Total Procedure Duration: 0 hours 9 minutes 7 seconds  Findings:      The perianal exam findings include multiple scars in the perianal area,       without presence of fistulous openings.      The digital rectal exam findings include rectal stricture, stretched       with DRE.      A benign-appearing, intrinsic severe stenosis measuring of unknown       length x 6 mm (inner diameter) was found at 30 cm proximal to the anus       and was non-traversed due to severe narrowing. This could only be       reached with the ultraslim colonoscope.      A benign-appearing, intrinsic moderate stenosis measuring 1 cm (in       length) x 1.2 cm (inner diameter) was found at 25 cm proximal to the       anus and was traversed after downsizing the scope to an ultraslim       colonoscope.      The mucosa vascular pattern in the rectum and in the sigmoid colon was       diffusely decreased - mucosa was pale and bled easily. 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 Impression:               - Multiple scars in the perianal area, without                            presence of fistulous openings found on perianal                            exam.                           - Rectal stricture found on digital rectal exam.                           - Stricture at 30 cm proximal to the anus. Could  not be traversed.                           - Stricture at 25 cm proximal to the anus.                           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                           - No specimens collected. Moderate Sedation:      Per Anesthesia Care Recommendation:           - Discharge patient to home (ambulatory).                           - Resume previous diet.                            - Will need to discuss other modalities for CRC                            screening. 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 All rights reserved. The codes documented in this report are preliminary and upon coder review may  be revised to meet current compliance requirements. Katrinka Blazing, MD Katrinka Blazing,  07/26/2022 1:24:00 PM This report has been signed electronically. Number of Addenda: 0

## 2022-07-26 NOTE — Telephone Encounter (Signed)
Spoke with patient's wife. Patient presented peristomal bleeding with intermittent oozing. She could not tell exactly where he was bleeding from.  As we performed flexible sigmoidoscopy and did not inspect the ostomy site, I am not sure why he is having the bleeding.  I advised her to apply pressure at the area where he is having the oozing.  If this persist she may need to take him to the ER for further evaluation.  I explained to her that I spoke with radiology and he could undergo a CT colonography with rectal contrast instillation if he is able to tolerate a rectal probe and balloon insufflation.  We will keep this as part of her plan depending on recommendation by Dr. Steele Sizer.

## 2022-07-26 NOTE — Telephone Encounter (Signed)
Spoke with the patient about the endoscopic findings and difficult to evaluate the rest of his colon given severe stricturing in his sigmoid.  He states that he may have significant discomfort with a barium enema as he had severe pain and rectal bleeding when he took the enemas today for his colonoscopy.  I will discuss with radiology if he can undergo a virtual colonoscopy without receiving any prep.  He will also like to be evaluated by a tertiary center to determine if they can possibly dilate the stricture and evaluate his colon proximal to the strictured area.  Hi Ann, can you please refer the patient to [facility] clinic with Dr. Chesley Mires (has seen him in the past)? Dx: Perianal Crohn's disease and fibrostenotic disease, difficult to screen for colon cancer due to severe stricturing  Thanks,   Katrinka Blazing, MD Gastroenterology and Hepatology [facility]

## 2022-07-26 NOTE — Transfer of Care (Signed)
Immediate Anesthesia Transfer of Care Note  Patient: Cory [name]  Procedure(s) Performed: FLEXIBLE SIGMOIDOSCOPY  Patient Location: Endoscopy Unit  Anesthesia Type:General  Level of Consciousness: drowsy, patient cooperative, and responds to stimulation  Airway & Oxygen Therapy: Patient Spontanous Breathing  Post-op Assessment: Report given to RN, Post -op Vital signs reviewed and stable, Patient moving all extremities X 4, and Patient able to stick tongue midline  Post vital signs: Reviewed  Last Vitals:  Vitals Value Taken Time  BP 115/63 07/26/22 1314  Temp 36.5 C 07/26/22 1314  Pulse 65 07/26/22 1314  Resp 19 07/26/22 1314  SpO2 95 % 07/26/22 1314    Last Pain:  Vitals:   07/26/22 1314  TempSrc: Oral  PainSc:       Patients Stated Pain Goal: 7 (07/26/22 1215)  Complications: No notable events documented.

## 2022-07-26 NOTE — Anesthesia Preprocedure Evaluation (Signed)
Anesthesia Evaluation  Patient identified by MRN, date of birth, ID band Patient awake    Reviewed: Allergy & Precautions, H&P , NPO status , Patient's Chart, lab work & pertinent test results, reviewed documented beta blocker date and time   Airway Mallampati: II  TM Distance: >3 FB Neck ROM: full    Dental no notable dental hx.    Pulmonary neg pulmonary ROS   Pulmonary exam normal breath sounds clear to auscultation       Cardiovascular Exercise Tolerance: Good negative cardio ROS  Rhythm:regular Rate:Normal     Neuro/Psych negative neurological ROS  negative psych ROS   GI/Hepatic negative GI ROS, Neg liver ROS,GERD  ,,  Endo/Other  negative endocrine ROS    Renal/GU negative Renal ROS  negative genitourinary   Musculoskeletal   Abdominal   Peds  Hematology negative hematology ROS (+) Blood dyscrasia, anemia   Anesthesia Other Findings   Reproductive/Obstetrics negative OB ROS                             Anesthesia Physical Anesthesia Plan  ASA: 2  Anesthesia Plan: General   Post-op Pain Management:    Induction:   PONV Risk Score and Plan: Propofol infusion  Airway Management Planned:   Additional Equipment:   Intra-op Plan:   Post-operative Plan:   Informed Consent: I have reviewed the patients History and Physical, chart, labs and discussed the procedure including the risks, benefits and alternatives for the proposed anesthesia with the patient or authorized representative who has indicated his/her understanding and acceptance.     Dental Advisory Given  Plan Discussed with: CRNA  Anesthesia Plan Comments:        Anesthesia Quick Evaluation

## 2022-07-26 NOTE — H&P (Signed)
Cory [name] is an 62 y.o. male.   Chief Complaint: Crohn's disease HPI: Cory [name] is a 62 y.o.  male with PMH Crohn's disease with colonic CD with perianal disease s/p ileostomy, who presents for follow up of Crohn's disease.   States he had some pain in his lower abdomen and rectal bleeding with prep. The patient denies having any nausea, vomiting, fever, chills, hematochezia, melena, hematemesis, abdominal distention, abdominal pain, diarrhea, jaundice, pruritus or weight loss.  Past Medical History:  Diagnosis Date   Crohn's disease (HCC)    History of degenerative disc disease     Past Surgical History:  Procedure Laterality Date   BIOPSY  10/28/2020   Procedure: BIOPSY;  Surgeon: Malissa Hippo, MD;  Location: [facility];  Service: Endoscopy;;   COLONOSCOPY  2013   COLONOSCOPY WITH PROPOFOL N/A 11/19/2015   Procedure: COLONOSCOPY WITH PROPOFOL;  Surgeon: Malissa Hippo, MD;  Location: [facility];  Service: Endoscopy;  Laterality: N/A;  random colon biopsy,  hepatic flexure polyp biopsy, sigmoid and rectal biopsy   ESOPHAGOGASTRODUODENOSCOPY (EGD) WITH PROPOFOL N/A 11/19/2015   Procedure: ESOPHAGOGASTRODUODENOSCOPY (EGD) WITH PROPOFOL;  Surgeon: Malissa Hippo, MD;  Location: [facility];  Service: Endoscopy;  Laterality: N/A;  biopsy duodenum, gastric biopsy   FLEXIBLE SIGMOIDOSCOPY  01/2012   FLEXIBLE SIGMOIDOSCOPY N/A 10/28/2020   Procedure: FLEXIBLE SIGMOIDOSCOPY;  Surgeon: Malissa Hippo, MD;  Location: [facility];  Service: Endoscopy;  Laterality: N/A;  12:50   HERNIA REPAIR     bilateral with mesh   ILEOSTOMY     MOHS SURGERY Right 2023   neck   Right thumb     Compound fracture   VASECTOMY      Family History  Problem Relation Age of Onset   Lymphoma Mother    Melanoma Father    Lung cancer Brother    Healthy Brother    Social History:  reports that he has never smoked. He has never been exposed to tobacco smoke. He has never used smokeless  tobacco. He reports that he does not drink alcohol and does not use drugs.  Allergies:  Allergies  Allergen Reactions   Imuran [Azathioprine] Swelling    Join pain , rash   Remicade [Infliximab] Other (See Comments)    Joint pain   Stelara [Ustekinumab (Murine)] Hives    Patient was a new start to Stelara, rec'd the Stelara IV on 11/09/2017. He developed hives and was given 30 mg of Prednisone. Per patient after being given the Prednisone he noted additional hives,Stelara has been discontinued.   Ppd [Tuberculin Purified Protein Derivative] Hives and Swelling    Joint pain. Patient rec'd both this and Imuran, not sure which caused this.    Medications Prior to Admission  Medication Sig Dispense Refill   Ascorbic Acid (VITAMIN C) 500 MG CHEW Chew 500 mg by mouth daily.     cholecalciferol (VITAMIN D3) 25 MCG (1000 UNIT) tablet Take 2,000 Units by mouth in the morning and at bedtime.     melatonin 3 MG TABS tablet Take 3 mg by mouth at bedtime.     mesalamine (CANASA) 1000 MG suppository PLACE 1 SUPPOSITORY (1,000 MG TOTAL) RECTALLY AT BEDTIME. 90 suppository 1   Probiotic Product (PROBIOTIC DAILY PO) Take 1 capsule by mouth daily.     rosuvastatin (CRESTOR) 10 MG tablet Take 10 mg by mouth every other day.     thiamine 100 MG tablet Take 100 mg by mouth daily.  nystatin-triamcinolone (MYCOLOG II) cream Apply 1 application topically 2 (two) times daily. (Patient taking differently: Apply 1 application  topically 2 (two) times daily as needed (irritation).) 30 g 1   OVER THE COUNTER MEDICATION Place 1 suppository rectally. Glutamax  high dose glutathione 350 mg glutayhione suppositories; every 4 days      No results found for this or any previous visit (from the past 48 hour(s)). No results found.  Review of Systems  All other systems reviewed and are negative.   Blood pressure 133/87, pulse 69, temperature (!) 97.4 F (36.3 C), temperature source Oral, resp. rate 17, height 6'  2" (1.88 m), weight 102.1 kg, SpO2 97 %. Physical Exam  GENERAL: The patient is AO x3, in no acute distress. HEENT: Head is normocephalic and atraumatic. EOMI are intact. Mouth is well hydrated and without lesions. NECK: Supple. No masses LUNGS: Clear to auscultation. No presence of rhonchi/wheezing/rales. Adequate chest expansion HEART: RRR, normal s1 and s2. ABDOMEN: Soft, nontender, no guarding, no peritoneal signs, and nondistended. BS +. No masses. Has ileostomy bag. EXTREMITIES: Without any cyanosis, clubbing, rash, lesions or edema. NEUROLOGIC: AOx3, no focal motor deficit. SKIN: no jaundice, no rashes  Assessment/Plan Hisashi Amadon is a 62 y.o.  male with PMH Crohn's disease with colonic CD with perianal disease s/p ileostomy, who presents for follow up of Crohn's disease.  Will proceed with colonoscopy.  Dolores Frame, MD 07/26/2022, 12:49 PM

## 2022-07-28 NOTE — Anesthesia Postprocedure Evaluation (Signed)
Anesthesia Post Note  Patient: Cory [name]  Procedure(s) Performed: FLEXIBLE SIGMOIDOSCOPY  Patient location during evaluation: Phase II Anesthesia Type: General Level of consciousness: awake Pain management: pain level controlled Vital Signs Assessment: post-procedure vital signs reviewed and stable Respiratory status: spontaneous breathing and respiratory function stable Cardiovascular status: blood pressure returned to baseline and stable Postop Assessment: no headache and no apparent nausea or vomiting Anesthetic complications: no Comments: Late entry   No notable events documented.   Last Vitals:  Vitals:   07/26/22 1215 07/26/22 1314  BP: 133/87 115/63  Pulse: 69 65  Resp: 17 19  Temp: (!) 36.3 C 36.5 C  SpO2: 97% 95%    Last Pain:  Vitals:   07/26/22 1316  TempSrc:   PainSc: 0-No pain                 Windell Norfolk

## 2022-07-31 ENCOUNTER — Encounter (HOSPITAL_COMMUNITY): Payer: Self-pay | Admitting: Gastroenterology

## 2022-08-15 ENCOUNTER — Other Ambulatory Visit (INDEPENDENT_AMBULATORY_CARE_PROVIDER_SITE_OTHER): Payer: Self-pay | Admitting: Gastroenterology

## 2022-08-15 ENCOUNTER — Encounter (INDEPENDENT_AMBULATORY_CARE_PROVIDER_SITE_OTHER): Payer: Self-pay | Admitting: Gastroenterology

## 2022-08-15 DIAGNOSIS — K50118 Crohn's disease of large intestine with other complication: Secondary | ICD-10-CM

## 2022-08-15 MED ORDER — MESALAMINE 1000 MG RE SUPP: 1000.0000 mg | Freq: Every day | RECTAL | 3 refills | Status: DC

## 2022-11-21 ENCOUNTER — Encounter (INDEPENDENT_AMBULATORY_CARE_PROVIDER_SITE_OTHER): Payer: Self-pay | Admitting: Gastroenterology

## 2022-12-14 ENCOUNTER — Encounter (INDEPENDENT_AMBULATORY_CARE_PROVIDER_SITE_OTHER): Payer: Self-pay | Admitting: Gastroenterology

## 2022-12-14 ENCOUNTER — Ambulatory Visit (INDEPENDENT_AMBULATORY_CARE_PROVIDER_SITE_OTHER): Payer: Medicare Other | Admitting: Gastroenterology

## 2022-12-14 VITALS — BP 129/83 | HR 73 | Temp 97.1°F | Ht 74.0 in | Wt 239.5 lb

## 2022-12-14 DIAGNOSIS — K50118 Crohn's disease of large intestine with other complication: Secondary | ICD-10-CM

## 2022-12-14 DIAGNOSIS — Z1321 Encounter for screening for nutritional disorder: Secondary | ICD-10-CM

## 2022-12-14 DIAGNOSIS — K603 Anal fistula, unspecified: Secondary | ICD-10-CM

## 2022-12-14 DIAGNOSIS — K50113 Crohn's disease of large intestine with fistula: Secondary | ICD-10-CM

## 2022-12-14 NOTE — Progress Notes (Signed)
Katrinka Blazing, M.D. Gastroenterology & Hepatology [facility] Gastroenterology [address] [place], [place] [address]  Primary Care Physician: Lennox Pippins, NP [address] [place] [address]  I will communicate my assessment and recommendations to the referring MD via EMR.  Problems: Colonic Crohn's disease (multiple segments) with perianal disease Recurrent perianal abscesses Complex perianal fistulae   History of Present Illness: Cory [name] is a 62 y.o.  male with PMH Crohn's disease with colonic CD with perianal disease, who presents for follow up of Crohn's disease.   The patient was last seen on 06/19/2022. At that time, the patient was scheduled to undergo colonoscopy.  He had severe stricturing in his colon which limited the inspection as the scope could not be advanced past the sigmoid colon.  He was referred to [facility] for a second opinion regarding strategies to perform screening colonoscopy in the setting of severe stricturing disease.  Patient was seen by Dr. Chesley Mires and Dr. Byrd Hesselbach on 10/26/2022 in the multidisciplinary IBD clinic.  It was considered that performing attempts to dilate his strictures for screening purposes will be more risky than beneficial.  As he has been doing relatively well on topical management with homemade enema and Canasa, he was advised to continue with this medication unless his symptoms were to recur.  Patient reports feeling well. States he has minimal drainage after using suppositories (Canasa and concoction made of Aloe Vera, probiotics, slippery elm and colloid silver) which he uses at least 5 days a week. He states that this combination soothes the area. He denies having any issues with induration In his perianal area for the last year.  Overall, he feels that he has been at his best since his diagnosis of Crohn's disease.  He empties his bag 5 times per day in average.  There have been no  changes in the stool consistency.  The patient denies having any nausea, vomiting, fever, chills, hematochezia, melena, hematemesis, abdominal distention, abdominal pain, diarrhea, jaundice, pruritus or weight loss.  Last flu shot:never Last pneumonia shot:never Last evaluation by dermatology: sees dermatology every 6 months for management of skin cancer with  Mohs surgery x3 Last zoster vaccine: never   Last Colonoscopy: 10/2015 - The examined portion of the ileum was normal. - Skip disease with extensive ulceration involving cecal mucosa, descending colon, sigmoid colon and rectum. 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 - Endoscopic findings consistent with fistula arising Crohn's disease involving colon and rectum.  Last flexible sigmoidoscopy on 07/26/2022 -patient was found to have multiple scars in the perianal area, presence of a rectal stricture which was dilated with digital rectal examination, severe stenosis measuring 10x12 mm at 25 centimeters which was transversed with ultraslim colonoscopy, however, there was severe stenosis measuring 6 mm in diameter at 30 cm which could not be transversed.  There was diffuse pallor consistent with diversion colitis  Past Medical History: Past Medical History:  Diagnosis Date   Crohn's disease (HCC)    History of degenerative disc disease     Past Surgical History: Past Surgical History:  Procedure Laterality Date   BIOPSY  10/28/2020   Procedure: BIOPSY;  Surgeon: Malissa Hippo, MD;  [facility];  Service: Endoscopy;;   COLONOSCOPY  2013   COLONOSCOPY WITH PROPOFOL N/A 11/19/2015   Procedure: COLONOSCOPY WITH PROPOFOL;  Surgeon: Malissa Hippo, MD;  [facility];  Service: Endoscopy;  Laterality: N/A;  random colon biopsy,  hepatic flexure polyp  biopsy, sigmoid and rectal biopsy   ESOPHAGOGASTRODUODENOSCOPY (EGD) WITH PROPOFOL N/A 11/19/2015   Procedure: ESOPHAGOGASTRODUODENOSCOPY (EGD) WITH PROPOFOL;   Surgeon: Malissa Hippo, MD;  [facility];  Service: Endoscopy;  Laterality: N/A;  biopsy duodenum, gastric biopsy   FLEXIBLE SIGMOIDOSCOPY  01/2012   FLEXIBLE SIGMOIDOSCOPY N/A 10/28/2020   Procedure: FLEXIBLE SIGMOIDOSCOPY;  Surgeon: Malissa Hippo, MD;  [facility];  Service: Endoscopy;  Laterality: N/A;  12:50   FLEXIBLE SIGMOIDOSCOPY N/A 07/26/2022   Procedure: FLEXIBLE SIGMOIDOSCOPY;  Surgeon: Dolores Frame, MD;  [facility];  Service: Gastroenterology;  Laterality: N/A;  12:30pm;asa 1   HERNIA REPAIR     bilateral with mesh   ILEOSTOMY     MOHS SURGERY Right 2023   neck   Right thumb     Compound fracture   VASECTOMY      Family History: Family History  Problem Relation Age of Onset   Lymphoma Mother    Melanoma Father    Lung cancer Brother    Healthy Brother     Social History: Social History   Tobacco Use  Smoking Status Never   Passive exposure: Never  Smokeless Tobacco Never   Social History   Substance and Sexual Activity  Alcohol Use No   Alcohol/week: 0.0 standard drinks of alcohol   Social History   Substance and Sexual Activity  Drug Use No    Allergies: Allergies  Allergen Reactions   Imuran [Azathioprine] Swelling    Join pain , rash   Remicade [Infliximab] Other (See Comments)    Joint pain   Stelara [Ustekinumab (Murine)] Hives    Patient was a new start to Stelara, rec'd the Stelara IV on 11/09/2017. He developed hives and was given 30 mg of Prednisone. Per patient after being given the Prednisone he noted additional hives,Stelara has been discontinued.   Ppd [Tuberculin Purified Protein Derivative] Hives and Swelling    Joint pain. Patient rec'd both this and Imuran, not sure which caused this.    Medications: Current Outpatient Medications  Medication Sig Dispense Refill   Ascorbic Acid (VITAMIN C) 500 MG CHEW Chew 500 mg by mouth daily.     cholecalciferol (VITAMIN D3) 25 MCG  (1000 UNIT) tablet Take 2,000 Units by mouth in the morning and at bedtime.     melatonin 3 MG TABS tablet Take 3 mg by mouth at bedtime.     mesalamine (CANASA) 1000 MG suppository Place 1 suppository (1,000 mg total) rectally at bedtime. 90 suppository 3   nystatin-triamcinolone (MYCOLOG II) cream Apply 1 application topically 2 (two) times daily. (Patient taking differently: Apply 1 application  topically 2 (two) times daily as needed (irritation).) 30 g 1   OVER THE COUNTER MEDICATION Vit B 1 summer time only.  He is also taking a concoction that his wife has mixed up with slippery elm and collide silver and aloe 5 mg rectally three out of seven days a week.     Probiotic Product (PROBIOTIC DAILY PO) Take 1 capsule by mouth daily.     rosuvastatin (CRESTOR) 10 MG tablet Take 10 mg by mouth every other day.     No current facility-administered medications for this visit.   Facility-Administered Medications Ordered in Other Visits  Medication Dose Route Frequency Provider Last Rate Last Admin   gadobenate dimeglumine (MULTIHANCE) injection 20 mL  20 mL Intravenous Once PRN Malissa Hippo, MD        Review of Systems:  GENERAL: negative for malaise, night sweats HEENT: No changes in hearing or vision, no nose bleeds or other nasal problems. NECK: Negative for lumps, goiter, pain and significant neck swelling RESPIRATORY: Negative for cough, wheezing CARDIOVASCULAR: Negative for chest pain, leg swelling, palpitations, orthopnea GI: SEE HPI MUSCULOSKELETAL: Negative for joint pain or swelling, back pain, and muscle pain. SKIN: Negative for lesions, rash PSYCH: Negative for sleep disturbance, mood disorder and recent psychosocial stressors. HEMATOLOGY Negative for prolonged bleeding, bruising easily, and swollen nodes. ENDOCRINE: Negative for cold or heat intolerance, polyuria, polydipsia and goiter. NEURO: negative for tremor, gait imbalance, syncope and seizures. The remainder of the  review of systems is noncontributory.   Physical Exam: BP 129/83 (BP Location: Left Arm, Patient Position: Sitting, Cuff Size: Normal)   Pulse 73   Temp (!) 97.1 F (36.2 C) (Temporal)   Ht 6\' 2"  (1.88 m)   Wt 239 lb 8 oz (108.6 kg)   BMI 30.75 kg/m  GENERAL: The patient is AO x3, in no acute distress. HEENT: Head is normocephalic and atraumatic. EOMI are intact. Mouth is well hydrated and without lesions. NECK: Supple. No masses LUNGS: Clear to auscultation. No presence of rhonchi/wheezing/rales. Adequate chest expansion HEART: RRR, normal s1 and s2. ABDOMEN: Soft, nontender, no guarding, no peritoneal signs, and nondistended. BS +. No masses. Has right sided ileostomy. EXTREMITIES: Without any cyanosis, clubbing, rash, lesions or edema. NEUROLOGIC: AOx3, no focal motor deficit. SKIN: no jaundice, no rashes  Imaging/Labs: as above  I personally reviewed and interpreted the available labs, imaging and endoscopic files.  Impression and Plan: Cory [name] is a 62 y.o.  male with PMH Crohn's disease with colonic CD with perianal disease, who presents for follow up of Crohn's disease.  The patient has had a very complicated history of Crohn disease and has failed multiple medications.  He has been having improvement of his symptoms after.  He underwent an ileostomy placement with diversion and with the use of topical homemade enemas and Canasa.  As he has not presented any significant activity of his disease in the perianal area, we will continue with this regimen for now.  He is due for surveillance labs, which will be ordered today.  I agree with Dr. Murrell Redden evaluation as undergoing endoscopic screening evaluations for him will increase the risk of complications.  We will not monitor this for now, but we discussed that if the FDA approves blood based colon cancer screening, we may want to use this modality in the future.  -Check CBC, CMP, vitamin D and CRP -Continue using Canasa  suppositories and homemade enema as needed -Return to clinic in 1 year  All questions were answered.      Katrinka Blazing, MD Gastroenterology and Hepatology [facility] Gastroenterology

## 2022-12-14 NOTE — Patient Instructions (Signed)
Perform blood workup Continue using Canasa suppositories and concoction as needed

## 2022-12-15 LAB — CBC WITH DIFFERENTIAL/PLATELET
Absolute Lymphocytes: 1269 {cells}/uL (ref 850–3900)
Absolute Monocytes: 508 {cells}/uL (ref 200–950)
Basophils Absolute: 38 {cells}/uL (ref 0–200)
Basophils Relative: 0.7 %
Eosinophils Absolute: 248 {cells}/uL (ref 15–500)
Eosinophils Relative: 4.6 %
HCT: 48.4 % (ref 38.5–50.0)
Hemoglobin: 16.1 g/dL (ref 13.2–17.1)
MCH: 28.6 pg (ref 27.0–33.0)
MCHC: 33.3 g/dL (ref 32.0–36.0)
MCV: 86 fL (ref 80.0–100.0)
MPV: 9.7 fL (ref 7.5–12.5)
Monocytes Relative: 9.4 %
Neutro Abs: 3337 {cells}/uL (ref 1500–7800)
Neutrophils Relative %: 61.8 %
Platelets: 274 10*3/uL (ref 140–400)
RBC: 5.63 10*6/uL (ref 4.20–5.80)
RDW: 13.7 % (ref 11.0–15.0)
Total Lymphocyte: 23.5 %
WBC: 5.4 10*3/uL (ref 3.8–10.8)

## 2022-12-15 LAB — VITAMIN D 25 HYDROXY (VIT D DEFICIENCY, FRACTURES): Vit D, 25-Hydroxy: 91 ng/mL (ref 30–100)

## 2022-12-15 LAB — COMPREHENSIVE METABOLIC PANEL
AG Ratio: 1.4 (calc) (ref 1.0–2.5)
ALT: 27 U/L (ref 9–46)
AST: 23 U/L (ref 10–35)
Albumin: 4.8 g/dL (ref 3.6–5.1)
Alkaline phosphatase (APISO): 98 U/L (ref 35–144)
BUN: 9 mg/dL (ref 7–25)
CO2: 29 mmol/L (ref 20–32)
Calcium: 10.1 mg/dL (ref 8.6–10.3)
Chloride: 101 mmol/L (ref 98–110)
Creat: 1.03 mg/dL (ref 0.70–1.35)
Globulin: 3.5 g/dL (ref 1.9–3.7)
Glucose, Bld: 82 mg/dL (ref 65–139)
Potassium: 4.6 mmol/L (ref 3.5–5.3)
Sodium: 138 mmol/L (ref 135–146)
Total Bilirubin: 1.1 mg/dL (ref 0.2–1.2)
Total Protein: 8.3 g/dL — ABNORMAL HIGH (ref 6.1–8.1)

## 2022-12-15 LAB — C-REACTIVE PROTEIN: CRP: 5.6 mg/L (ref <8.0)

## 2022-12-18 ENCOUNTER — Ambulatory Visit (INDEPENDENT_AMBULATORY_CARE_PROVIDER_SITE_OTHER): Payer: Medicare Other | Admitting: Gastroenterology

## 2022-12-20 ENCOUNTER — Encounter (INDEPENDENT_AMBULATORY_CARE_PROVIDER_SITE_OTHER): Payer: Self-pay

## 2023-01-17 NOTE — Progress Notes (Signed)
Spoke to patient and informed him of his arrival time at 11:00am, NPO after MN, and having a responsible adult to drive him home and stay with him for 24 hour . Pt stated he understood and will arrive to [facility] tower [facility].

## 2023-01-18 ENCOUNTER — Encounter (HOSPITAL_COMMUNITY): Payer: Self-pay | Admitting: Cardiology

## 2023-01-18 ENCOUNTER — Ambulatory Visit (HOSPITAL_BASED_OUTPATIENT_CLINIC_OR_DEPARTMENT_OTHER)
Admission: RE | Admit: 2023-01-18 | Discharge: 2023-01-18 | Disposition: A | Discharge status: Home / Self Care | Payer: Medicare Other | Source: Ambulatory Visit | Attending: Cardiology | Admitting: Cardiology

## 2023-01-18 ENCOUNTER — Encounter (HOSPITAL_COMMUNITY)
Admission: RE | Disposition: A | Discharge status: Home / Self Care | Payer: Self-pay | Source: Home / Self Care | Attending: Cardiology

## 2023-01-18 ENCOUNTER — Other Ambulatory Visit: Payer: Self-pay

## 2023-01-18 ENCOUNTER — Ambulatory Visit (HOSPITAL_COMMUNITY)
Admission: RE | Admit: 2023-01-18 | Discharge: 2023-01-18 | Disposition: A | Discharge status: Home / Self Care | Payer: Medicare Other | Attending: Cardiology | Admitting: Cardiology

## 2023-01-18 ENCOUNTER — Ambulatory Visit (HOSPITAL_COMMUNITY): Payer: Medicare Other | Admitting: Anesthesiology

## 2023-01-18 DIAGNOSIS — Z79899 Other long term (current) drug therapy: Secondary | ICD-10-CM | POA: Insufficient documentation

## 2023-01-18 DIAGNOSIS — I358 Other nonrheumatic aortic valve disorders: Secondary | ICD-10-CM | POA: Diagnosis not present

## 2023-01-18 DIAGNOSIS — E785 Hyperlipidemia, unspecified: Secondary | ICD-10-CM | POA: Diagnosis not present

## 2023-01-18 DIAGNOSIS — K5090 Crohn's disease, unspecified, without complications: Secondary | ICD-10-CM | POA: Insufficient documentation

## 2023-01-18 DIAGNOSIS — I350 Nonrheumatic aortic (valve) stenosis: Secondary | ICD-10-CM | POA: Diagnosis not present

## 2023-01-18 DIAGNOSIS — I359 Nonrheumatic aortic valve disorder, unspecified: Secondary | ICD-10-CM

## 2023-01-18 DIAGNOSIS — Q2381 Bicuspid aortic valve: Secondary | ICD-10-CM

## 2023-01-18 HISTORY — PX: TRANSESOPHAGEAL ECHOCARDIOGRAM (CATH LAB): EP1270

## 2023-01-18 LAB — ECHO TEE
AR max vel: 1.71 cm2
AV Area VTI: 1.89 cm2
AV Area mean vel: 1.66 cm2
AV Mean grad: 10 mm[Hg]
AV Peak grad: 15.5 mm[Hg]
Ao pk vel: 1.97 m/s

## 2023-01-18 SURGERY — TRANSESOPHAGEAL ECHOCARDIOGRAM (TEE) (CATHLAB)
Anesthesia: Monitor Anesthesia Care

## 2023-01-18 MED ORDER — PROPOFOL 500 MG/50ML IV EMUL
INTRAVENOUS | Status: DC | PRN
  Administered 2023-01-18: 125 ug/kg/min via INTRAVENOUS

## 2023-01-18 MED ORDER — SODIUM CHLORIDE 0.9 % IV SOLN: INTRAVENOUS | Status: DC

## 2023-01-18 MED ORDER — LIDOCAINE 2% (20 MG/ML) 5 ML SYRINGE
INTRAMUSCULAR | Status: DC | PRN
  Administered 2023-01-18: 60 mg via INTRAVENOUS

## 2023-01-18 MED ORDER — PROPOFOL 10 MG/ML IV BOLUS
INTRAVENOUS | Status: DC | PRN
  Administered 2023-01-18: 20 mg via INTRAVENOUS

## 2023-01-18 NOTE — Anesthesia Postprocedure Evaluation (Signed)
Anesthesia Post Note  Patient: Cory [name]  Procedure(s) Performed: TRANSESOPHAGEAL ECHOCARDIOGRAM     Patient location during evaluation: Cath Lab Anesthesia Type: MAC Level of consciousness: awake and alert Pain management: pain level controlled Vital Signs Assessment: post-procedure vital signs reviewed and stable Respiratory status: spontaneous breathing, nonlabored ventilation and respiratory function stable Cardiovascular status: stable and blood pressure returned to baseline Postop Assessment: no apparent nausea or vomiting Anesthetic complications: no  No notable events documented.  Last Vitals:  Vitals:   01/18/23 1350 01/18/23 1400  BP: 123/80 113/87  Pulse: 82 75  Resp: 13 13  Temp:  36.7 C  SpO2: 97% 97%    Last Pain:  Vitals:   01/18/23 1400  TempSrc: Temporal  PainSc: 0-No pain                 Kaileah Shevchenko,W. EDMOND

## 2023-01-18 NOTE — Progress Notes (Signed)
Spoke to Dr. Anne Fu and Dr. Sampson Goon about being unable to obtain an ISTAT sample on pt. Both Mds stated that it was ok to proceed with the procedure with the current labs for the pt that were done on 12/14/22.

## 2023-01-18 NOTE — CV Procedure (Addendum)
   Transesophageal Echocardiogram  Indications: Question bicuspid aortic valve  Time out performed  During this procedure the patient was administered propofol under anesthesiology supervision to achieve and maintain moderate sedation.  The patient's heart rate, blood pressure, and oxygen saturation are monitored continuously during the procedure.   Findings:  Left Ventricle: Normal EF 60%  Mitral Valve: Normal  Aortic Valve: Aortic valve appears trileaflet with right coronary cusp somewhat calcified and rigid but not fused to adjacent cusp.  Valve is not bicuspid.  With rigidity, there is some turbulence in supravalvular space.  Aorta: Normal dimensions.  No dilatation.  Tricuspid Valve: Normal  Left Atrium: Normal, no left atrial appendage thrombus  Right Atrium: Normal  Intraatrial septum: Slightly redundant but no shunt  Bubble Contrast Study: Not performed  Impression: Trileaflet aortic valve.  Donato Schultz, MD

## 2023-01-18 NOTE — Anesthesia Preprocedure Evaluation (Addendum)
Anesthesia Evaluation  Patient identified by MRN, date of birth, ID band Patient awake    Reviewed: Allergy & Precautions, H&P , NPO status , Patient's Chart, lab work & pertinent test results  Airway Mallampati: II  TM Distance: >3 FB Neck ROM: Full    Dental no notable dental hx. (+) Teeth Intact, Dental Advisory Given   Pulmonary neg pulmonary ROS   Pulmonary exam normal breath sounds clear to auscultation       Cardiovascular negative cardio ROS  Rhythm:Regular Rate:Normal     Neuro/Psych negative neurological ROS  negative psych ROS   GI/Hepatic Neg liver ROS,GERD  ,,  Endo/Other  negative endocrine ROS    Renal/GU negative Renal ROS  negative genitourinary   Musculoskeletal   Abdominal   Peds  Hematology negative hematology ROS (+)   Anesthesia Other Findings   Reproductive/Obstetrics negative OB ROS                             Anesthesia Physical Anesthesia Plan  ASA: 2  Anesthesia Plan: MAC   Post-op Pain Management: Minimal or no pain anticipated   Induction: Intravenous  PONV Risk Score and Plan: 1 and Propofol infusion  Airway Management Planned: Natural Airway and Simple Face Mask  Additional Equipment:   Intra-op Plan:   Post-operative Plan:   Informed Consent: I have reviewed the patients History and Physical, chart, labs and discussed the procedure including the risks, benefits and alternatives for the proposed anesthesia with the patient or authorized representative who has indicated his/her understanding and acceptance.     Dental advisory given  Plan Discussed with: CRNA  Anesthesia Plan Comments:        Anesthesia Quick Evaluation

## 2023-01-18 NOTE — Transfer of Care (Signed)
Immediate Anesthesia Transfer of Care Note  Patient: Cory [name]  Procedure(s) Performed: TRANSESOPHAGEAL ECHOCARDIOGRAM  Patient Location: PACUcath lab  Anesthesia Type:MAC  Level of Consciousness: awake and alert   Airway & Oxygen Therapy: Patient Spontanous Breathing  Post-op Assessment: Report given to RN and Post -op Vital signs reviewed and stable  Post vital signs: Reviewed and stable  Last Vitals:  Vitals Value Taken Time  BP    Temp    Pulse 91 01/18/23 1341  Resp    SpO2 98 % 01/18/23 1341  Vitals shown include unfiled device data.  Last Pain:  Vitals:   01/18/23 1158  TempSrc:   PainSc: 0-No pain         Complications: No notable events documented.

## 2023-01-18 NOTE — H&P (Signed)
  History and physical  62 year old male with Crohn's disease with newly discovered mild aortic stenosis after murmur was heard here for evaluation of possible bicuspid aortic valve.  No chest pain fevers chills nausea vomiting syncope bleeding.  Notes from atrium reviewed.  He does take Crestor 10 mg once a day for hyperlipidemia has been treated in the past with Remicade, Humira and other Crohn type medication.  In 2020 he required a diverting ileostomy.  Prior cardiology office note from 01/11/2023 reviewed, had elevated CRP as well.  Initially was started on statins, Crestor at but he is now taking it 3 times weekly and supplements with red yeast rice he was seen at the [facility] clinic there was concern for potential aortic stenosis due to bicuspid aortic valve the aortic valve appeared calcified on echocardiogram with very mild aortic stenosis and may be related to bicuspid aortic valve disease therefore he is having a TEE done here today.  On exam: Alert x 3 in NAD, Normal resp effort, previously described 1/6 SEM. No edema  Labs: Creat 1.03, K 4.6 most recent labs  A:P  Proceed with TEE. Risks and benefits have been explained. He is willing to proceed. ?Bicuspid AV.   Donato Schultz, MD

## 2023-01-19 ENCOUNTER — Encounter (HOSPITAL_COMMUNITY): Payer: Self-pay | Admitting: Cardiology

## 2023-01-22 ENCOUNTER — Telehealth (INDEPENDENT_AMBULATORY_CARE_PROVIDER_SITE_OTHER): Payer: Self-pay | Admitting: *Deleted

## 2023-01-22 ENCOUNTER — Other Ambulatory Visit (INDEPENDENT_AMBULATORY_CARE_PROVIDER_SITE_OTHER): Payer: Self-pay | Admitting: Gastroenterology

## 2023-01-22 MED ORDER — METRONIDAZOLE 500 MG PO TABS: 500.0000 mg | ORAL_TABLET | Freq: Three times a day (TID) | ORAL | 0 refills | Status: DC

## 2023-01-22 MED ORDER — CIPROFLOXACIN HCL 500 MG PO TABS: 500.0000 mg | ORAL_TABLET | Freq: Two times a day (BID) | ORAL | 0 refills | Status: DC

## 2023-01-22 NOTE — Telephone Encounter (Signed)
Patient having a little pain, little swelling, no fever. Per chelsea appt given for Thursday.

## 2023-01-22 NOTE — Telephone Encounter (Signed)
Started on flagyl and cipro last Thursday. Had 5 days worth but states Dr. Karilyn Cota would give it for 6-8 weeks at a time when he had a flare up. No fever, states better since starting meds. States doing sitz baths and all the things he usually does with flare. ( She said they have been dealing with this for 20 years )

## 2023-01-22 NOTE — Telephone Encounter (Signed)
Pt's wife Lurena Joiner work cell is  [phone]

## 2023-01-22 NOTE — Telephone Encounter (Signed)
Chelsea sent one week supply of meds to pharmacy. Pt was notified.

## 2023-01-22 NOTE — Telephone Encounter (Signed)
Cory [name] ( this patient's wife sent a secure chat for her husband. I copied it below. He was last seen 12/14/22 for Crohn's )   Morning, I think Dr Levon Hedger is off,  We had 5 days of cipro and flagyl at home but thankfully [name] has not needed it in a long time.  He does have one fistula that started getting inflamed, so we started the cipro flagyl.  He will need a refill at [facility] in [place], Do you have a provider that can call this in?  They can look at his chart and see that this is what he has been on in the pasted.

## 2023-01-22 NOTE — Telephone Encounter (Signed)
Aware to keep appt on Thursday.

## 2023-01-23 NOTE — Progress Notes (Unsigned)
Referring Provider: Lennox Pippins, NP Primary Care Physician:  Lennox Pippins, NP Primary GI Physician: Dr. Levon Hedger  Chief Complaint  Patient presents with   anal fistula     Anal fistula that is hurting     HPI:   Cory [name] is a 62 y.o. 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 He did have improvement in his symptoms thereafter but still had a few flares of perianal fistula recurrence. Last fistula was in November 2023 and was managed with antibiotics alone, no seton required.   Patient saw Dr. Byrd Hesselbach virtually 03/21/2022 to discuss possibility of colectomy with proctectomy and end ileostomy, but at the time of the visit, patient wanted to manage his disease expectantly given the potential cons of undergoing a colectomy.   Flexible sigmoidoscopy on 07/26/2022 -patient was found to have multiple scars in the perianal area, presence of a rectal stricture which was dilated with digital rectal examination, severe stenosis measuring 10x12 mm at 25 centimeters which was transversed with ultraslim colonoscopy, however, there was severe stenosis measuring 6 mm in diameter at 30 cm which could not be transversed.  There was diffuse pallor consistent with diversion colitis   He was seen at [facility] by Dr. Chesley Mires and Dr. Byrd Hesselbach on 10/26/2022 in the multidisciplinary IBD clinic.  It was considered that performing attempts to dilate his strictures for colorectal screening purposes will be more risky than beneficial.  As he has been doing relatively well on topical management with homemade enema (concoction made of Aloe Vera, probiotics, slippery elm and colloid silver) and Canasa, he was advised to continue with this medication unless his symptoms were to recur.   Patient has tried infliximab, adalimumab, vedolizumab, ustekinumab, tofacitinib and  certolizumab in the past.  All these treatments have failed or had adverse reactions.   Dr. Levon Hedger has discussed Skyrizi or Rinvoq, but patient has declined unless he has evidence of worsening inflammation, especially as he has failed multiple medications in the past.    Today:  For the last year, he has done very well. States he got comfortable and stopped doing his nightly enema concoction of Aloe Vera, probiotics, slippery elm and colloid silver.  He had decreased the frequency to 2 or 3 times a week.  About 2 weeks ago, he had a recurrence of a fistula with drainage.  States he is wearing a pad.  Reports he did have some pain prior to the fistula tract opening, but since it has opened, his pain has resolved.  He is doing sitz bath's daily and also resumed his nightly enema.  No abdominal pain, fever, chills.   Small amount of scan blood in mucous per rectum intermittently.  Empties bag about 5 times a day which is his baseline. No blood or black output.  Has been on 6 days of antibiotics so far as he had some left over.   Wanting to hold off on other medications for Crohn's management and colectomy for now.  He is hoping that getting back on a healthier diet and resuming his nightly enemas we will get him back to his baseline of doing well.   Last flu shot: Never Last pneumonia shot: Never Last evaluation by dermatology: sees dermatology every 6 months Last zoster vaccine: Never He has no interest in getting vaccinations.   Last Colonoscopy: 10/2015 - The examined portion of the ileum  was normal. - Skip disease with extensive ulceration involving cecal mucosa, descending colon, sigmoid colon and rectum. 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 - Endoscopic findings consistent with fistula arising Crohn's disease involving colon and rectum.   Past Medical History:  Diagnosis Date   Crohn's disease (HCC)    History of degenerative disc disease     Past Surgical History:   Procedure Laterality Date   BIOPSY  10/28/2020   Procedure: BIOPSY;  Surgeon: Malissa Hippo, MD;  [facility]: [facility];  Service: Endoscopy;;   COLONOSCOPY  2013   COLONOSCOPY WITH PROPOFOL N/A 11/19/2015   Procedure: COLONOSCOPY WITH PROPOFOL;  Surgeon: Malissa Hippo, MD;  Location: [facility];  Service: Endoscopy;  Laterality: N/A;  random colon biopsy,  hepatic flexure polyp biopsy, sigmoid and rectal biopsy   ESOPHAGOGASTRODUODENOSCOPY (EGD) WITH PROPOFOL N/A 11/19/2015   Procedure: ESOPHAGOGASTRODUODENOSCOPY (EGD) WITH PROPOFOL;  Surgeon: Malissa Hippo, MD;  Location: [facility];  Service: Endoscopy;  Laterality: N/A;  biopsy duodenum, gastric biopsy   FLEXIBLE SIGMOIDOSCOPY  01/2012   FLEXIBLE SIGMOIDOSCOPY N/A 10/28/2020   Procedure: FLEXIBLE SIGMOIDOSCOPY;  Surgeon: Malissa Hippo, MD;  Location: [facility];  Service: Endoscopy;  Laterality: N/A;  12:50   FLEXIBLE SIGMOIDOSCOPY N/A 07/26/2022   Procedure: FLEXIBLE SIGMOIDOSCOPY;  Surgeon: Dolores Frame, MD;  Location: [facility];  Service: Gastroenterology;  Laterality: N/A;  12:30pm;asa 1   HERNIA REPAIR     bilateral with mesh   ILEOSTOMY     MOHS SURGERY Right 2023   neck   Right thumb     Compound fracture   TRANSESOPHAGEAL ECHOCARDIOGRAM (CATH LAB) N/A 01/18/2023   Procedure: TRANSESOPHAGEAL ECHOCARDIOGRAM;  Surgeon: Jake Bathe, MD;  Location: [facility];  Service: Cardiovascular;  Laterality: N/A;   VASECTOMY      Current Outpatient Medications  Medication Sig Dispense Refill   Ascorbic Acid (VITAMIN C) 500 MG CHEW Chew 500 mg by mouth daily.     cholecalciferol (VITAMIN D3) 25 MCG (1000 UNIT) tablet Take 1,000 Units by mouth daily.     GARLIC PO Take 1,324 mg by mouth daily.     melatonin 3 MG TABS tablet Take 3 mg by mouth at bedtime.     mesalamine (CANASA) 1000 MG suppository Place 1 suppository (1,000 mg total) rectally at bedtime. (Patient taking differently: Place  1,000 mg rectally 2 (two) times a week.) 90 suppository 3   Milk Thistle 1000 MG CAPS Take 1,000 mg by mouth daily.     nystatin-triamcinolone (MYCOLOG II) cream Apply 1 application topically 2 (two) times daily. (Patient taking differently: Apply 1 application  topically 2 (two) times daily as needed (irritation).) 30 g 1   Omega-3 Fatty Acids (FISH OIL PO) Take 1 capsule by mouth daily. Blend     OVER THE COUNTER MEDICATION Place 3 mLs rectally 3 (three) times a week.  He is also taking a concoction that his wife has mixed up with slippery elm and collide silver and aloe 5 mg rectally three out of seven days a week.     OVER THE COUNTER MEDICATION Take 1 capsule by mouth daily. Prostavan     Probiotic Product (PROBIOTIC DAILY PO) Take 30 mg by mouth daily. 2     Red Yeast Rice Extract (RED YEAST RICE PO) Take 1,200 mg by mouth daily.     rosuvastatin (CRESTOR) 5 MG tablet Take 5 mg by mouth daily.  ciprofloxacin (CIPRO) 500 MG tablet Take 1 tablet (500 mg total) by mouth 2 (two) times daily. 28 tablet 0   metroNIDAZOLE (FLAGYL) 500 MG tablet Take 1 tablet (500 mg total) by mouth 2 (two) times daily. 28 tablet 0   No current facility-administered medications for this visit.   Facility-Administered Medications Ordered in Other Visits  Medication Dose Route Frequency Provider Last Rate Last Admin   gadobenate dimeglumine (MULTIHANCE) injection 20 mL  20 mL Intravenous Once PRN Malissa Hippo, MD        Allergies as of 01/25/2023 - Review Complete 01/25/2023  Allergen Reaction Noted   Imuran [azathioprine] Swelling 02/03/2015   Remicade [infliximab] Other (See Comments) 02/03/2015   Stelara [ustekinumab (murine)] Hives 11/13/2017   Adalimumab Hives 01/16/2023   Ppd [tuberculin purified protein derivative] Hives and Swelling 02/03/2015    Family History  Problem Relation Age of Onset   Lymphoma Mother    Melanoma Father    Lung cancer Brother    Healthy Brother     Social  History   Socioeconomic History   Marital status: Married    Spouse name: Not on file   Number of children: Not on file   Years of education: Not on file   Highest education level: Not on file  Occupational History   Not on file  Tobacco Use   Smoking status: Never    Passive exposure: Never   Smokeless tobacco: Never  Vaping Use   Vaping status: Never Used  Substance and Sexual Activity   Alcohol use: No    Alcohol/week: 0.0 standard drinks of alcohol   Drug use: No   Sexual activity: Yes    Birth control/protection: Surgical  Other Topics Concern   Not on file  Social History Narrative   Not on file   Social Drivers of Health   Financial Resource Strain: Not on file  Food Insecurity: Not on file  Transportation Needs: Not on file  Physical Activity: Not on file  Stress: Not on file  Social Connections: Not on file    Review of Systems: Gen: Denies fever, chills, cold or flulike symptoms, presyncope, syncope. GI: See HPI  Heme: See HPI  Physical Exam: BP 130/82 (BP Location: Right Arm, Patient Position: Sitting, Cuff Size: Large)   Pulse 76   Temp 97.7 F (36.5 C) (Temporal)   Ht 6\' 2"  (1.88 m)   Wt 246 lb 9.6 oz (111.9 kg)   BMI 31.66 kg/m  General:   Alert and oriented. No distress noted. Pleasant and cooperative.  Head:  Normocephalic and atraumatic. Eyes:  Conjuctiva clear without scleral icterus. Abdomen:  +BS, soft, non-tender and non-distended. No rebound or guarding. No HSM or masses noted. Rectal: Diffuse perianal scaring. Area in the right gluteal area with purulent drainage, likely perianal fistula.  No flatulence in the right gluteal area or induration.  No flatulence or induration appreciated in the left gluteal area. Msk:  Symmetrical without gross deformities. Normal posture. Neurologic:  Alert and  oriented x4 Psych:  Normal mood and affect.    Assessment:  62 y.o. male with history of refractory colonic Crohn's disease with stricturing  and penitrating perianal fistula disease s/p  laparoscopic creation of diverting loop ileostomy in November 2020. He did have improvement in his symptoms thereafter but still had a few flares of perianal fistula recurrence. He has tried and failed numerous medications (detailed in HPI).  He did see Dr. Byrd Hesselbach with [facility] in February to discuss possible  colectomy, but ultimately decided to hold off on this unless absolutely necessary.   Last colonoscopy attempt in June 2024, limited to flex sig due to stricturing disease (detailed in HPI).  He had been doing well over the last year on Canasa and homemade enema (made of Aloe Vera, probiotics, slippery elm and colloid silver), but now with recurrent perianal fistula that recurred about 2 weeks ago.  On exam, he has evidence of perianal fistula in the right gluteal area with purulent drainage.  He has already been on antibiotics for about 6 days so far as he had leftover antibiotics at home.  Considering his history of complex fistulizing disease, I will continue his antibiotics for total of 3 weeks and also obtain a pelvic MRI 2 to 4 weeks after completing his antibiotics to evaluate the fistula.  Pending MRI results, patient may need referral back to Dr. Byrd Hesselbach.    Regarding chronic management of his Crohn's disease, patient states he has not been eating as healthy as he had previously and had gotten slack with his nightly enemas and feels this was the cause of his recent flare.  He has not interested in any new medications or reconsideration for colectomy at this time.  He is hopeful that changing his diet and resuming his nightly enemas we will get him back to prior baseline.  Regarding CRC screening, unable to perform screening due to stricturing disease.  Last flexible sigmoidoscopy in June with rectal stricture, severe stenosis measuring 10x12 mm at 25 centimeters which was transversed with ultraslim colonoscopy, however, there was severe stenosis measuring  6 mm in diameter at 30 cm which could not be transversed.  Also with diffuse pallor consistent with diversion colitis.  He was seen by [facility] to see about the recommendations for CRC screening.  It was considered that performing attempts to dilate his strictures for colorectal screening purposes will be more risky than beneficial.    Plan:  Cipro 500 mg twice daily and Flagyl 500 mg twice daily for an additional 14 days. Pelvic MRI 2-4 weeks after finishing antibiotics.  Continue Canasa and nightly homemade enemas. Follow-up TBD following MRI.    Ermalinda Memos, PA-C [facility] 01/25/2023

## 2023-01-25 ENCOUNTER — Ambulatory Visit (INDEPENDENT_AMBULATORY_CARE_PROVIDER_SITE_OTHER): Payer: Medicare Other | Admitting: Gastroenterology

## 2023-01-25 ENCOUNTER — Encounter: Payer: Self-pay | Admitting: Gastroenterology

## 2023-01-25 ENCOUNTER — Telehealth: Payer: Self-pay | Admitting: *Deleted

## 2023-01-25 ENCOUNTER — Encounter: Payer: Medicare Other | Admitting: Gastroenterology

## 2023-01-25 ENCOUNTER — Ambulatory Visit (HOSPITAL_COMMUNITY): Admission: RE | Admit: 2023-01-25 | Payer: Medicare Other | Source: Ambulatory Visit

## 2023-01-25 ENCOUNTER — Encounter (HOSPITAL_COMMUNITY): Payer: Self-pay

## 2023-01-25 ENCOUNTER — Encounter: Payer: Self-pay | Admitting: *Deleted

## 2023-01-25 VITALS — BP 130/82 | HR 76 | Temp 97.7°F | Ht 74.0 in | Wt 246.6 lb

## 2023-01-25 DIAGNOSIS — K50113 Crohn's disease of large intestine with fistula: Secondary | ICD-10-CM | POA: Diagnosis not present

## 2023-01-25 DIAGNOSIS — K603 Anal fistula, unspecified: Secondary | ICD-10-CM | POA: Diagnosis not present

## 2023-01-25 MED ORDER — CIPROFLOXACIN HCL 500 MG PO TABS: 500.0000 mg | ORAL_TABLET | Freq: Two times a day (BID) | ORAL | 0 refills | Status: DC

## 2023-01-25 MED ORDER — METRONIDAZOLE 500 MG PO TABS: 500.0000 mg | ORAL_TABLET | Freq: Two times a day (BID) | ORAL | 0 refills | Status: DC

## 2023-01-25 NOTE — Patient Instructions (Signed)
Continue ciprofloxacin 500 mg twice daily and Flagyl 500 mg twice daily.  I have sent in a prescription for 14 additional days of antibiotics for now.  I am getting you scheduled for an MRI of your pelvis to evaluate your fistula.  I will have further recommendations for you following your MRI result.   Ermalinda Memos, PA-C [facility]

## 2023-01-25 NOTE — Telephone Encounter (Signed)
Pt informed that MRI is scheduled for 02/27/23 , arrive at 8:45 am to check in. Advised pt that I sent a mychart message with information and number to call if needs to reschedule

## 2023-01-25 NOTE — Telephone Encounter (Signed)
Letta Median, PA-C: Just spoke with Dr. Levon Hedger on this patient. He recommended MRI not be completed until 2-3 weeks after antibiotics which he will be on for another 2 weeks, so we will need to push out his MRI for 4-5 weeks.   Pt has been informed. Will call with new appt.

## 2023-02-12 ENCOUNTER — Encounter (INDEPENDENT_AMBULATORY_CARE_PROVIDER_SITE_OTHER): Payer: Self-pay | Admitting: Gastroenterology

## 2023-02-12 ENCOUNTER — Other Ambulatory Visit (INDEPENDENT_AMBULATORY_CARE_PROVIDER_SITE_OTHER): Payer: Self-pay | Admitting: Gastroenterology

## 2023-02-12 ENCOUNTER — Other Ambulatory Visit: Payer: Self-pay | Admitting: Gastroenterology

## 2023-02-12 DIAGNOSIS — K50113 Crohn's disease of large intestine with fistula: Secondary | ICD-10-CM

## 2023-02-12 MED ORDER — METRONIDAZOLE 500 MG PO TABS
500.0000 mg | ORAL_TABLET | Freq: Two times a day (BID) | ORAL | 0 refills | Status: AC
Start: 2023-02-12 — End: 2023-03-05

## 2023-02-12 MED ORDER — CIPROFLOXACIN HCL 500 MG PO TABS: 500.0000 mg | ORAL_TABLET | Freq: Two times a day (BID) | ORAL | 0 refills | Status: AC

## 2023-02-27 ENCOUNTER — Ambulatory Visit (HOSPITAL_COMMUNITY)
Admission: RE | Admit: 2023-02-27 | Discharge: 2023-02-27 | Disposition: A | Discharge status: Home / Self Care | Payer: Medicare Other | Source: Ambulatory Visit | Attending: Gastroenterology | Admitting: Gastroenterology

## 2023-02-27 DIAGNOSIS — K603 Anal fistula, unspecified: Secondary | ICD-10-CM | POA: Insufficient documentation

## 2023-02-27 DIAGNOSIS — K50113 Crohn's disease of large intestine with fistula: Secondary | ICD-10-CM | POA: Insufficient documentation

## 2023-02-27 MED ORDER — GADOBUTROL 1 MMOL/ML IV SOLN
10.0000 mL | Freq: Once | INTRAVENOUS | Status: AC | PRN
  Administered 2023-02-27: 10 mL via INTRAVENOUS

## 2023-03-06 ENCOUNTER — Encounter: Payer: Self-pay | Admitting: *Deleted

## 2023-03-08 NOTE — Progress Notes (Addendum)
 Referring Provider: Gretta Shona POUR, NP Primary Care Physician:  Gretta Shona POUR, NP Primary GI Physician: Dr. Eartha  Chief Complaint  Patient presents with   Follow-up    Follow up after procedure. Discuss test results     HPI:   Georgio Hattabaugh is a 63 y.o. 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 He did have improvement in his symptoms thereafter but still had a few flares of perianal fistula recurrence.   Patient saw Dr. Darral virtually 03/21/2022 to discuss possibility of colectomy with proctectomy and end ileostomy, but at the time of the visit, patient wanted to manage his disease expectantly given the potential cons of undergoing a colectomy.   Flexible sigmoidoscopy on 07/26/2022 -patient was found to have multiple scars in the perianal area, presence of a rectal stricture which was dilated with digital rectal examination, severe stenosis measuring 10x12 mm at 25 centimeters which was transversed with ultraslim colonoscopy, however, there was severe stenosis measuring 6 mm in diameter at 30 cm which could not be transversed.  There was diffuse pallor consistent with diversion colitis   He was seen at [facility] by Dr. Harlee and Dr. Darral on 10/26/2022 in the multidisciplinary IBD clinic.  It was considered that performing attempts to dilate his strictures for colorectal screening purposes will be more risky than beneficial.  As he has been doing relatively well on topical management with homemade enema (concoction made of Aloe Vera, probiotics, slippery elm and colloid silver) and Canasa , he was advised to continue with this medication unless his symptoms were to recur.    Patient has tried infliximab, adalimumab, vedolizumab , ustekinumab , tofacitinib  and certolizumab in the past.  All these treatments have failed or had adverse  reactions.   Dr. Eartha has discussed Skyrizi or Rinvoq, but patient has declined unless he has evidence of worsening inflammation, especially as he has failed multiple medications in the past.   Last seen 01/25/23. Reported doing well for the last year, got comfortable and stopped doing nightly enema concoction of Aloe Vera, probiotics, slippery elm and colloid silver routinely, down to 2-3 times a week. Reported recurrent fistula drainage about 2 weeks ago. Now doing sitz baths and had resumed nightly enemas. He was taking left over antibiotics, on day 6.  Rectal exam showed diffuse perianal scarring, area in the right gluteal cleft with purulent drainage.  Recommended continuing cipro  and flagyl  x 14 days, Pelvic MRI in 2-4 weeks, continue Canasa  and nightly homemade enemas.   On 02/12/23, patient reported improvement and fistula drainage, but still present.  Dr. Eartha sent in an additional 3 weeks of antibiotics.  MRI pelvis with and without contrast 02/27/2023: Intersphincteric anal fistula arising posteriorly at approximately 6 oclock inserting at the right aspect of the gluteal cleft, tract length 7 cm.  Fissure like structure at the left aspect of the gluteal cleft which could reflect a large fissure or a fistula tract that had previously been setonized or incised, but not a patent fistula tract.  No abscesses or fluid collections.  Chronic fibrofatty mural stratification of the rectum, mild mucosal hyperenhancement, consistent with anorectal involvement of Crohn's disease and some degree of active inflammation.   Today: Drainage has improved significantly. Only with a scant amount.  Stopped antibiotics the day of his MRI.  No worsening of drainage since stopping antibiotics.  No rectal pain.  No bloody drainage.  Reports he has changed his diet significantly, eliminated sugar, carbs.  He did undergo diet for a month and has now added back and some vegetables.  Down 15 lbs. He also resumed  his daily homemade enema concoction.  Tells me that he stopped Canasa  suppository as his wife did some research and did not think he needed to continue these as he was taking them nightly when he developed a fistula.  Ostomy output has been normal.  No abdominal pain.  Not interested in seeing Dr. Darral right now.  States he will contact him if he feels that he needs to.  Interested in trying any other Crohn's medications at this time due to concerns about side effects or treatment failure as he has failed numerous other medications in the past.   Last flu shot: Never Last pneumonia shot: Never Last evaluation by dermatology: sees dermatology every 6 months Last zoster vaccine: Never He has no interest in getting vaccinations.   Last Colonoscopy: 10/2015 - The examined portion of the ileum was normal. - Skip disease with extensive ulceration involving cecal mucosa, descending colon, sigmoid colon and rectum. 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 - Endoscopic findings consistent with fistula arising Crohn's disease involving colon and rectum.   Past Medical History:  Diagnosis Date   Crohn's disease (HCC)    History of degenerative disc disease     Past Surgical History:  Procedure Laterality Date   BIOPSY  10/28/2020   Procedure: BIOPSY;  Surgeon: Golda Claudis PENNER, MD;  Location: [facility];  Service: Endoscopy;;   COLONOSCOPY  2013   COLONOSCOPY WITH PROPOFOL  N/A 11/19/2015   Procedure: COLONOSCOPY WITH PROPOFOL ;  Surgeon: Claudis PENNER Golda, MD;  Location: [facility];  Service: Endoscopy;  Laterality: N/A;  random colon biopsy,  hepatic flexure polyp biopsy, sigmoid and rectal biopsy   ESOPHAGOGASTRODUODENOSCOPY (EGD) WITH PROPOFOL  N/A 11/19/2015   Procedure: ESOPHAGOGASTRODUODENOSCOPY (EGD) WITH PROPOFOL ;  Surgeon: Claudis PENNER Golda, MD;  Location: [facility];  Service: Endoscopy;  Laterality: N/A;  biopsy duodenum, gastric biopsy   FLEXIBLE SIGMOIDOSCOPY  01/2012    FLEXIBLE SIGMOIDOSCOPY N/A 10/28/2020   Procedure: FLEXIBLE SIGMOIDOSCOPY;  Surgeon: Golda Claudis PENNER, MD;  Location: [facility];  Service: Endoscopy;  Laterality: N/A;  12:50   FLEXIBLE SIGMOIDOSCOPY N/A 07/26/2022   Procedure: FLEXIBLE SIGMOIDOSCOPY;  Surgeon: Eartha Angelia Sieving, MD;  Location: [facility];  Service: Gastroenterology;  Laterality: N/A;  12:30pm;asa 1   HERNIA REPAIR     bilateral with mesh   ILEOSTOMY     MOHS SURGERY Right 2023   neck   Right thumb     Compound fracture   TRANSESOPHAGEAL ECHOCARDIOGRAM (CATH LAB) N/A 01/18/2023   Procedure: TRANSESOPHAGEAL ECHOCARDIOGRAM;  Surgeon: Jeffrie Oneil BROCKS, MD;  Location: [facility];  Service: Cardiovascular;  Laterality: N/A;   VASECTOMY      Current Outpatient Medications  Medication Sig Dispense Refill   Ascorbic Acid (VITAMIN C) 500 MG CHEW Chew 500 mg by mouth daily.     cholecalciferol (VITAMIN D3) 25 MCG (1000 UNIT) tablet Take 1,000 Units by mouth daily.     ciprofloxacin  (CIPRO ) 500 MG tablet Take 1 tablet (500 mg total) by mouth 2 (two) times daily for 14 days. 28 tablet 0   GARLIC PO Take 3,600 mg by mouth daily.     melatonin 3 MG TABS tablet Take 3 mg by mouth at bedtime.     metroNIDAZOLE  (FLAGYL ) 500 MG tablet Take  1 tablet (500 mg total) by mouth 2 (two) times daily for 14 days. 28 tablet 0   Milk Thistle 1000 MG CAPS Take 1,000 mg by mouth daily.     nystatin -triamcinolone  (MYCOLOG II) cream Apply 1 application topically 2 (two) times daily. (Patient taking differently: Apply 1 application  topically 2 (two) times daily as needed (irritation).) 30 g 1   Omega-3 Fatty Acids (FISH OIL PO) Take 1 capsule by mouth daily. Blend     OVER THE COUNTER MEDICATION Place 3 mLs rectally 3 (three) times a week.  He is also taking a concoction that his wife has mixed up with slippery elm and collide silver and aloe 5 mg rectally three out of seven days a week.     OVER THE COUNTER MEDICATION Take 1 capsule  by mouth daily. Prostavan     Probiotic Product (PROBIOTIC DAILY PO) Take 30 mg by mouth daily. 2     Red Yeast Rice Extract (RED YEAST RICE PO) Take 1,200 mg by mouth daily.     rosuvastatin (CRESTOR) 5 MG tablet Take 5 mg by mouth daily.     mesalamine  (CANASA ) 1000 MG suppository Place 1 suppository (1,000 mg total) rectally at bedtime. (Patient not taking: Reported on 03/09/2023) 90 suppository 3   No current facility-administered medications for this visit.    Allergies as of 03/09/2023 - Review Complete 03/09/2023  Allergen Reaction Noted   Imuran [azathioprine] Swelling 02/03/2015   Remicade [infliximab] Other (See Comments) 02/03/2015   Stelara  [ustekinumab  (murine)] Hives 11/13/2017   Adalimumab Hives 01/16/2023   Ppd [tuberculin purified protein derivative] Hives and Swelling 02/03/2015    Family History  Problem Relation Age of Onset   Lymphoma Mother    Melanoma Father    Lung cancer Brother    Healthy Brother     Social History   Socioeconomic History   Marital status: Married    Spouse name: Not on file   Number of children: Not on file   Years of education: Not on file   Highest education level: Not on file  Occupational History   Not on file  Tobacco Use   Smoking status: Never    Passive exposure: Never   Smokeless tobacco: Never  Vaping Use   Vaping status: Never Used  Substance and Sexual Activity   Alcohol use: No    Alcohol/week: 0.0 standard drinks of alcohol   Drug use: No   Sexual activity: Yes    Birth control/protection: Surgical  Other Topics Concern   Not on file  Social History Narrative   Not on file   Social Drivers of Health   Financial Resource Strain: Not on file  Food Insecurity: Not on file  Transportation Needs: Not on file  Physical Activity: Not on file  Stress: Not on file  Social Connections: Not on file    Review of Systems: Gen: Denies fever, chills, cold or flu like symptoms, pre-syncope, or syncope.  CV:  Denies chest pain, palpitations.  Resp: Denies dyspnea, cough.  GI: See HPI Heme: See HPI  Physical Exam: BP 126/76 (BP Location: Right Arm, Patient Position: Sitting, Cuff Size: Large)   Pulse 73   Temp 97.7 F (36.5 C) (Temporal)   Ht 6' 2 (1.88 m)   Wt 231 lb 3.2 oz (104.9 kg)   BMI 29.68 kg/m  General:   Alert and oriented. No distress noted. Pleasant and cooperative.  Head:  Normocephalic and atraumatic. Eyes:  Conjuctiva clear without scleral  icterus. Heart:  S1, S2 present without murmurs appreciated. Lungs:  Clear to auscultation bilaterally. No wheezes, rales, or rhonchi. No distress.  Abdomen:  +BS, soft, non-tender and non-distended. No rebound or guarding. No HSM or masses noted. Rectal: Diffuse perianal scarring.  Small red bump in the right gluteal area with a small amount of purulent drainage.  No surrounding erythema, induration, flatulence to the area.  Msk:  Symmetrical without gross deformities. Normal posture. Extremities:  Without edema. Neurologic:  Alert and  oriented x4 Psych:  Normal mood and affect.    Assessment:  63 y.o. 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 He did have improvement in his symptoms thereafter but still had a few flares of perianal fistula recurrence. He has tried and failed numerous medications (detailed in HPI).  He saw Dr. Darral with [facility] in February 2024  to discuss possible colectomy, but ultimately decided to hold off on this unless absolutely necessary.   Last colonoscopy attempt in June 2024, limited to flex sig due to stricturing disease (detailed in HPI).  He had been doing well over the last year on Canasa  and homemade enema (made of Aloe Vera, probiotics, slippery elm and colloid silver), but developed ecurrent perianal fistula in December.   Now s/p 4.5 weeks of antibiotics  (cipro  and flagyl ) and reports improvement in fistula drainage though he does still have scant amounts.  Pelvic MRI completed 02/27/2023 showing intersphincteric anal fistula arising posteriorly at approximately 6 o'clock and starting at right aspect of gluteal cleft.  Fissure like structure at the left aspect of the gluteal cleft that could reflect large fissure or fistula tract previously setonized or incised, but no patent fistula tract.  No associated abscess or fluid collection.  Rectal exam today with small amount of purulent drainage from small red bump in the right gluteal area.  Discussed the possibility of returning to see general surgery at [facility] for management of his fistula to determine if seton placement is needed due to ongoing drainage.  Also discussed the possibility of starting other maintenance medications for Crohn's management (Skyrizi or Rinvoq).  However, patient has declined seeing general surgery or starting other medications.  He is convinced that the reason for his recent flare is from poor diet and slacking off on his homemade nightly enema concoction which he has resumed.  He has also modified his diet significantly and feels that he is moving in the right direction.  Notably, he stopped Canasa  suppositories since I saw him last as he felt they were not helping in light of developing a fistula while using them.  We discussed that he should resume these today.  As he does continue to have some purulent drainage, I will extend his antibiotic course for an additional 2 weeks.    Plan:  Ciprofloxacin  500 mg twice daily and Flagyl  500 mg twice daily x 14 days Resume Canasa  suppository. Continue home enemas Follow-up in 4 weeks with Dr. Eartha Josette Centers, PA-C [facility] Gastroenterology 03/09/2023   I have reviewed the note and agree with the APP's assessment as described in this progress note  Toribio Eartha, MD Gastroenterology and Hepatology [facility] Gastroenterology

## 2023-03-09 ENCOUNTER — Encounter: Payer: Self-pay | Admitting: Gastroenterology

## 2023-03-09 ENCOUNTER — Ambulatory Visit (INDEPENDENT_AMBULATORY_CARE_PROVIDER_SITE_OTHER): Payer: Medicare Other | Admitting: Gastroenterology

## 2023-03-09 VITALS — BP 126/76 | HR 73 | Temp 97.7°F | Ht 74.0 in | Wt 231.2 lb

## 2023-03-09 DIAGNOSIS — K603 Anal fistula, unspecified: Secondary | ICD-10-CM | POA: Diagnosis not present

## 2023-03-09 DIAGNOSIS — K50113 Crohn's disease of large intestine with fistula: Secondary | ICD-10-CM

## 2023-03-09 MED ORDER — METRONIDAZOLE 500 MG PO TABS: 500.0000 mg | ORAL_TABLET | Freq: Two times a day (BID) | ORAL | 0 refills | Status: AC

## 2023-03-09 MED ORDER — CIPROFLOXACIN HCL 500 MG PO TABS: 500.0000 mg | ORAL_TABLET | Freq: Two times a day (BID) | ORAL | 0 refills | Status: AC

## 2023-03-09 NOTE — Patient Instructions (Addendum)
 I am going to go ahead and send in another 2 week course of antibiotics as you are still having purulent drainage from your fistula. Please take all of the antibiotics.   I recommend taking Canasa  suppository nightly and continuing your home enema concoctions.   I will have you follow-up with Dr. Eartha in 4 weeks. Do not hesitate to call sooner if needed.   Josette Centers, PA-C [facility] Gastroenterology

## 2023-04-05 ENCOUNTER — Ambulatory Visit (INDEPENDENT_AMBULATORY_CARE_PROVIDER_SITE_OTHER): Payer: Medicare Other | Admitting: Gastroenterology

## 2023-04-05 ENCOUNTER — Encounter (INDEPENDENT_AMBULATORY_CARE_PROVIDER_SITE_OTHER): Payer: Self-pay | Admitting: Gastroenterology

## 2023-04-05 VITALS — BP 123/78 | HR 64 | Temp 97.1°F | Ht 74.0 in | Wt 233.8 lb

## 2023-04-05 DIAGNOSIS — K603 Anal fistula, unspecified: Secondary | ICD-10-CM

## 2023-04-05 DIAGNOSIS — K50113 Crohn's disease of large intestine with fistula: Secondary | ICD-10-CM

## 2023-04-05 DIAGNOSIS — K610 Anal abscess: Secondary | ICD-10-CM

## 2023-04-05 DIAGNOSIS — K50118 Crohn's disease of large intestine with other complication: Secondary | ICD-10-CM

## 2023-04-05 NOTE — Progress Notes (Signed)
 Katrinka Blazing, M.D. Gastroenterology & Hepatology [facility] [address] [place], [place] [address]  Primary Care Physician: Lennox Pippins, NP [address] [place] [address]  I will communicate my assessment and recommendations to the referring MD via EMR.  Problems: Colonic Crohn's disease (multiple segments) with perianal disease Recurrent perianal abscesses Complex perianal fistulae  History of Present Illness: Cory [name] is a 62 y.o. male with history of refractory colonic Crohn's disease with stricturing and penetrating perianal fistula disease s/p laparoscopic creation of diverting loop ileostomy in November 2020 , who presents for follow up of Crohn's disease and perianal fistula.  The patient was last seen on 03/09/2023. At that time, the patient was continued on ciprofloxacin and Flagyl for total of 6 weeks.  He was continued on Canasa suppositories and home enemas (concoction made of Aloe Vera, probiotics, slippery elm and colloid silver) .  Patient saw Dr. Byrd Hesselbach virtually 03/21/2022 to discuss possibility of colectomy with proctectomy and end ileostomy, but at the time of the visit, patient wanted to manage his disease expectantly given the potential cons of undergoing a colectomy.   Patient reports he finished the antibiotics and is feeling well. Still has some drainage from one of the fistulas. There are some days he may not have drainage, some days it is a small amount of secretions. Some days he does not use a pad at all.  He is still using the concoction, but not the mesalamine enemas.  Has not noticed any output changes in his ostomy.  The patient denies having any other complaints such as nausea, vomiting, fever, chills, hematochezia, melena, hematemesis, abdominal distention, abdominal pain, diarrhea, jaundice, pruritus. Has lost some weight recently as he has tried to do better diet and decrease carb  intake.  Patient has tried infliximab, adalimumab, vedolizumab, ustekinumab, tofacitinib and certolizumab in the past.  All these treatments have failed or had adverse reactions.   Most recent MR of the pelvis showed the following findings (02/27/2023) 1. Intersphincteric anal fistula arising posteriorly at approximately 6 o'clock face in lithotomy position, and inserting at the right aspect of the gluteal cleft, tract length 7 cm. A previously seen branching fistula component extending posteriorly from the proximal aspect of the fistula tract does not appear to be patent on today's examination. 2. At the left aspect of the gluteal cleft there is a new fissure like structure which may reflect a large fissure or a fistula tract which has been setonized or incised in the interval but is not a patent fistula tract at this time. 3. No associated abscess or fluid collection. 4. Chronic fibrofatty mural stratification of the rectum, with mild mucosal hyperenhancement, consistent with anorectal involvement of Crohn's disease and some degree of active inflammation.  Flexible sigmoidoscopy on 07/26/2022 -patient was found to have multiple scars in the perianal area, presence of a rectal stricture which was dilated with digital rectal examination, severe stenosis measuring 10x12 mm at 25 centimeters which was transversed with ultraslim colonoscopy, however, there was severe stenosis measuring 6 mm in diameter at 30 cm which could not be transversed. There was diffuse pallor consistent with diversion colitis   Past Medical History: Past Medical History:  Diagnosis Date   Crohn's disease (HCC)    History of degenerative disc disease     Past Surgical History: Past Surgical History:  Procedure Laterality Date   BIOPSY  10/28/2020   Procedure: BIOPSY;  Surgeon: Malissa Hippo, MD;  [facility];  Service: Endoscopy;;   COLONOSCOPY  2013   COLONOSCOPY WITH PROPOFOL N/A 11/19/2015    Procedure: COLONOSCOPY WITH PROPOFOL;  Surgeon: Malissa Hippo, MD;  [facility];  Service: Endoscopy;  Laterality: N/A;  random colon biopsy,  hepatic flexure polyp biopsy, sigmoid and rectal biopsy   ESOPHAGOGASTRODUODENOSCOPY (EGD) WITH PROPOFOL N/A 11/19/2015   Procedure: ESOPHAGOGASTRODUODENOSCOPY (EGD) WITH PROPOFOL;  Surgeon: Malissa Hippo, MD;  [facility];  Service: Endoscopy;  Laterality: N/A;  biopsy duodenum, gastric biopsy   FLEXIBLE SIGMOIDOSCOPY  01/2012   FLEXIBLE SIGMOIDOSCOPY N/A 10/28/2020   Procedure: FLEXIBLE SIGMOIDOSCOPY;  Surgeon: Malissa Hippo, MD;  [facility];  Service: Endoscopy;  Laterality: N/A;  12:50   FLEXIBLE SIGMOIDOSCOPY N/A 07/26/2022   Procedure: FLEXIBLE SIGMOIDOSCOPY;  Surgeon: Dolores Frame, MD;  [facility];  Service: Gastroenterology;  Laterality: N/A;  12:30pm;asa 1   HERNIA REPAIR     bilateral with mesh   ILEOSTOMY     MOHS SURGERY Right 2023   neck   Right thumb     Compound fracture   TRANSESOPHAGEAL ECHOCARDIOGRAM (CATH LAB) N/A 01/18/2023   Procedure: TRANSESOPHAGEAL ECHOCARDIOGRAM;  Surgeon: Jake Bathe, MD;  [facility];  Service: Cardiovascular;  Laterality: N/A;   VASECTOMY      Family History: Family History  Problem Relation Age of Onset   Lymphoma Mother    Melanoma Father    Lung cancer Brother    Healthy Brother     Social History: Social History   Tobacco Use  Smoking Status Never   Passive exposure: Never  Smokeless Tobacco Never   Social History   Substance and Sexual Activity  Alcohol Use No   Alcohol/week: 0.0 standard drinks of alcohol   Social History   Substance and Sexual Activity  Drug Use No    Allergies: Allergies  Allergen Reactions   Imuran [Azathioprine] Swelling    Join pain , rash   Remicade [Infliximab] Other (See Comments)    Joint pain   Stelara [Ustekinumab (Murine)] Hives    Patient was a new  start to Stelara, rec'd the Stelara IV on 11/09/2017. He developed hives and was given 30 mg of Prednisone. Per patient after being given the Prednisone he noted additional hives,Stelara has been discontinued.   Adalimumab Hives   Ppd [Tuberculin Purified Protein Derivative] Hives and Swelling    Joint pain. Patient rec'd both this and Imuran, not sure which caused this.    Medications: Current Outpatient Medications  Medication Sig Dispense Refill   Ascorbic Acid (VITAMIN C) 500 MG CHEW Chew 500 mg by mouth daily.     cholecalciferol (VITAMIN D3) 25 MCG (1000 UNIT) tablet Take 1,000 Units by mouth daily.     GARLIC PO Take 8,295 mg by mouth daily.     melatonin 3 MG TABS tablet Take 3 mg by mouth at bedtime.     Milk Thistle 1000 MG CAPS Take 1,000 mg by mouth daily.     nystatin-triamcinolone (MYCOLOG II) cream Apply 1 application topically 2 (two) times daily. (Patient taking differently: Apply 1 application  topically 2 (two) times daily as needed (irritation).) 30 g 1   Omega-3 Fatty Acids (FISH OIL PO) Take 1 capsule by mouth daily. Blend     OVER THE COUNTER MEDICATION Place 3 mLs rectally 3 (three) times a week.  He is also taking a concoction that his wife has mixed up  with slippery elm and collide silver and aloe 5 mg rectally three out of seven days a week.     OVER THE COUNTER MEDICATION Take 1 capsule by mouth daily. Prostavan     Probiotic Product (PROBIOTIC DAILY PO) Take 30 mg by mouth daily. 2     Red Yeast Rice Extract (RED YEAST RICE PO) Take 1,200 mg by mouth daily.     rosuvastatin (CRESTOR) 5 MG tablet Take 5 mg by mouth daily.     mesalamine (CANASA) 1000 MG suppository Place 1 suppository (1,000 mg total) rectally at bedtime. (Patient not taking: Reported on 03/09/2023) 90 suppository 3   No current facility-administered medications for this visit.    Review of Systems: GENERAL: negative for malaise, night sweats HEENT: No changes in hearing or vision, no nose  bleeds or other nasal problems. NECK: Negative for lumps, goiter, pain and significant neck swelling RESPIRATORY: Negative for cough, wheezing CARDIOVASCULAR: Negative for chest pain, leg swelling, palpitations, orthopnea GI: SEE HPI MUSCULOSKELETAL: Negative for joint pain or swelling, back pain, and muscle pain. SKIN: Negative for lesions, rash PSYCH: Negative for sleep disturbance, mood disorder and recent psychosocial stressors. HEMATOLOGY Negative for prolonged bleeding, bruising easily, and swollen nodes. ENDOCRINE: Negative for cold or heat intolerance, polyuria, polydipsia and goiter. NEURO: negative for tremor, gait imbalance, syncope and seizures. The remainder of the review of systems is noncontributory.   Physical Exam: BP 123/78 (BP Location: Right Arm, Patient Position: Sitting, Cuff Size: Normal)   Pulse 64   Temp (!) 97.1 F (36.2 C) (Temporal)   Ht 6\' 2"  (1.88 m)   Wt 233 lb 12.8 oz (106.1 kg)   BMI 30.02 kg/m  GENERAL: The patient is AO x3, in no acute distress. HEENT: Head is normocephalic and atraumatic. EOMI are intact. Mouth is well hydrated and without lesions. NECK: Supple. No masses LUNGS: Clear to auscultation. No presence of rhonchi/wheezing/rales. Adequate chest expansion HEART: RRR, normal s1 and s2. ABDOMEN: Soft, nontender, no guarding, no peritoneal signs, and nondistended. BS +. No masses. Has ostomy bag with brown stool. RECTAL EXAM: Presence of multiple scars in the rectum, there was presence of a small fistula opening at 3' without active drainage, normal tone, no masses palpated in DRE.Chaperone: Joanette Gula, CMA EXTREMITIES: Without any cyanosis, clubbing, rash, lesions or edema. NEUROLOGIC: AOx3, no focal motor deficit. SKIN: no jaundice, no rashes  Imaging/Labs: as above  I personally reviewed and interpreted the available labs, imaging and endoscopic files.  Impression and Plan: Cory [name] is a 63 y.o. male with history of  refractory colonic Crohn's disease with stricturing and penetrating perianal fistula disease s/p laparoscopic creation of diverting loop ileostomy in November 2020 , who presents for follow up of Crohn's disease and perianal fistula.  Patient has presented a very complex history of perianal disease due to chronic disease that has required multiple seton placement.  Most recent imaging methods showed presence of persistent complex fistulous.  He has required a prolonged antibiotic course recently which led to significant improvement of his drainage.  He is still having some mild drainage at the moment but this is much better compared to prior.  Patient is happy with current management and will like to continue with his homemade concoction.  We discussed that if his symptoms were to recur he may need a repeat antibiotic course and repeat consideration for surgical management.  I also discussed in detail the clinical results of removal as management option for perianal Crohn's disease.  Patient will let me know in the future if he is interested in taking this medication.  -Continue daily use of homemade concoction -Patient showed keep [place] updated if there is any increase output from fistula, may need repeat antibiotic course and consideration for colorectal surgery evaluation with Dr. Byrd Hesselbach -Consider starting Rinvoq if symptoms worsen  All questions were answered.      Katrinka Blazing, MD Gastroenterology and Hepatology [facility]

## 2023-04-05 NOTE — Patient Instructions (Signed)
 Continue daily use of homemade concoction Please keep [place] updated if there is any increase output from fistula, may need repeat antibiotic course and consideration for colorectal surgery evaluation with Dr. Byrd Hesselbach Consider starting Rinvoq if symptoms worsen

## 2023-09-26 ENCOUNTER — Encounter (INDEPENDENT_AMBULATORY_CARE_PROVIDER_SITE_OTHER): Payer: Self-pay | Admitting: Gastroenterology

## 2023-11-14 ENCOUNTER — Encounter (INDEPENDENT_AMBULATORY_CARE_PROVIDER_SITE_OTHER): Payer: Self-pay | Admitting: Gastroenterology

## 2023-12-31 ENCOUNTER — Encounter (INDEPENDENT_AMBULATORY_CARE_PROVIDER_SITE_OTHER): Payer: Self-pay | Admitting: Gastroenterology

## 2023-12-31 ENCOUNTER — Telehealth (INDEPENDENT_AMBULATORY_CARE_PROVIDER_SITE_OTHER): Payer: Self-pay

## 2023-12-31 ENCOUNTER — Ambulatory Visit (INDEPENDENT_AMBULATORY_CARE_PROVIDER_SITE_OTHER): Admitting: Gastroenterology

## 2023-12-31 VITALS — BP 108/73 | HR 69 | Temp 97.8°F | Ht 74.0 in | Wt 201.2 lb

## 2023-12-31 DIAGNOSIS — K50118 Crohn's disease of large intestine with other complication: Secondary | ICD-10-CM

## 2023-12-31 DIAGNOSIS — Z1321 Encounter for screening for nutritional disorder: Secondary | ICD-10-CM

## 2023-12-31 DIAGNOSIS — K50113 Crohn's disease of large intestine with fistula: Secondary | ICD-10-CM

## 2023-12-31 DIAGNOSIS — K603 Anal fistula, unspecified: Secondary | ICD-10-CM

## 2023-12-31 DIAGNOSIS — K9089 Other intestinal malabsorption: Secondary | ICD-10-CM | POA: Diagnosis not present

## 2023-12-31 MED ORDER — CIPROFLOXACIN HCL 500 MG PO TABS: 500.0000 mg | ORAL_TABLET | Freq: Two times a day (BID) | ORAL | 0 refills | Status: AC

## 2023-12-31 MED ORDER — METRONIDAZOLE 500 MG PO TABS: 500.0000 mg | ORAL_TABLET | Freq: Three times a day (TID) | ORAL | 0 refills | Status: AC

## 2023-12-31 NOTE — Telephone Encounter (Signed)
 No PA is required for Medicare Part A and B. BCBS is inactive.

## 2023-12-31 NOTE — Telephone Encounter (Signed)
 Patient scheduled for MRI pelvis on 02/12/2023 at 11am, arrive at 10:30 am. Patient aware and verbalized understanding.

## 2023-12-31 NOTE — Progress Notes (Unsigned)
 Shelle Fortune, M.D. Gastroenterology & Hepatology [facility] [address] [place], [place] [address]  Primary Care Physician: Gretta Shona POUR, NP [address] [place] [address]  I will communicate my assessment and recommendations to the referring MD via EMR.  Problems: Colonic Crohn's disease (multiple segments) with perianal disease Recurrent perianal abscesses Complex perianal fistulae   History of Present Illness: Cory [name] is a 63 y.o. male with history of refractory colonic Crohn's disease with stricturing and penetrating perianal fistula disease s/p laparoscopic creation of diverting loop ileostomy in November 2020 , who presents for follow up of Crohn's disease and perianal fistula.  The patient was last seen on 04/05/2023. At that time, the patient continued the use of homemade concoction for perianal disease.  He is still using the concoction, but not the mesalamine  enemas. He states that he decreased the frequency of concoction use recently as he was feeling well. Due to this, he recently felt that his fistula had increased output and urgency to clean for the last 1.5 months, which he did not have for the last year. He reports that he has noticed some mucus and blood output. He states that he had some leftover ciprofloxacin  and flagyl , so he took these for a 2 weeks (he took an extra week of Flagyl ).  States that he got back on his concoction just after his symptoms worsened.  The patient denies having any nausea, vomiting, fever, chills, hematochezia, melena, hematemesis, abdominal distention, abdominal pain, diarrhea, jaundice, pruritus. Lost weight on purpose. No changes in output from ostomy.  Patient has tried infliximab, adalimumab, vedolizumab , ustekinumab , tofacitinib  and certolizumab in the past.  All these treatments have failed or had adverse reactions.   Patient saw Dr. Darral in 03/21/2022 to  discuss possibility of colectomy with proctectomy and end ileostomy, but at the time of the visit, patient wanted to manage his disease expectantly given the potential cons of undergoing a colectomy.   Most recent MR of the pelvis showed the following findings (02/27/2023) 1. Intersphincteric anal fistula arising posteriorly at approximately 6 o'clock face in lithotomy position, and inserting at the right aspect of the gluteal cleft, tract length 7 cm. A previously seen branching fistula component extending posteriorly from the proximal aspect of the fistula tract does not appear to be patent on today's examination. 2. At the left aspect of the gluteal cleft there is a new fissure like structure which may reflect a large fissure or a fistula tract which has been setonized or incised in the interval but is not a patent fistula tract at this time. 3. No associated abscess or fluid collection. 4. Chronic fibrofatty mural stratification of the rectum, with mild mucosal hyperenhancement, consistent with anorectal involvement of Crohn's disease and some degree of active inflammation.   Flexible sigmoidoscopy on 07/26/2022 -patient was found to have multiple scars in the perianal area, presence of a rectal stricture which was dilated with digital rectal examination, severe stenosis measuring 10x12 mm at 25 centimeters which was transversed with ultraslim colonoscopy, however, there was severe stenosis measuring 6 mm in diameter at 30 cm which could not be transversed. There was diffuse pallor consistent with diversion colitis   Past Medical History: Past Medical History:  Diagnosis Date   Crohn's disease (HCC)    History of degenerative disc disease     Past Surgical History: Past Surgical History:  Procedure Laterality Date   BIOPSY  10/28/2020   Procedure: BIOPSY;  Surgeon: Golda,  Claudis PENNER, MD;  [facility];  Service: Endoscopy;;   COLONOSCOPY  2013   COLONOSCOPY WITH PROPOFOL   N/A 11/19/2015   Procedure: COLONOSCOPY WITH PROPOFOL ;  Surgeon: Claudis PENNER Rivet, MD;  [facility];  Service: Endoscopy;  Laterality: N/A;  random colon biopsy,  hepatic flexure polyp biopsy, sigmoid and rectal biopsy   ESOPHAGOGASTRODUODENOSCOPY (EGD) WITH PROPOFOL  N/A 11/19/2015   Procedure: ESOPHAGOGASTRODUODENOSCOPY (EGD) WITH PROPOFOL ;  Surgeon: Claudis PENNER Rivet, MD;  [facility];  Service: Endoscopy;  Laterality: N/A;  biopsy duodenum, gastric biopsy   FLEXIBLE SIGMOIDOSCOPY  01/2012   FLEXIBLE SIGMOIDOSCOPY N/A 10/28/2020   Procedure: FLEXIBLE SIGMOIDOSCOPY;  Surgeon: Rivet Claudis PENNER, MD;  [facility];  Service: Endoscopy;  Laterality: N/A;  12:50   FLEXIBLE SIGMOIDOSCOPY N/A 07/26/2022   Procedure: FLEXIBLE SIGMOIDOSCOPY;  Surgeon: Eartha Angelia Sieving, MD;  [facility];  Service: Gastroenterology;  Laterality: N/A;  12:30pm;asa 1   HERNIA REPAIR     bilateral with mesh   ILEOSTOMY     MOHS SURGERY Right 2023   neck   Right thumb     Compound fracture   TRANSESOPHAGEAL ECHOCARDIOGRAM (CATH LAB) N/A 01/18/2023   Procedure: TRANSESOPHAGEAL ECHOCARDIOGRAM;  Surgeon: Jeffrie Oneil BROCKS, MD;  [facility];  Service: Cardiovascular;  Laterality: N/A;   VASECTOMY      Family History: Family History  Problem Relation Age of Onset   Lymphoma Mother    Melanoma Father    Lung cancer Brother    Healthy Brother     Social History: Social History   Tobacco Use  Smoking Status Never   Passive exposure: Never  Smokeless Tobacco Never   Social History   Substance and Sexual Activity  Alcohol Use No   Alcohol/week: 0.0 standard drinks of alcohol   Social History   Substance and Sexual Activity  Drug Use No    Allergies: Allergies  Allergen Reactions   Imuran [Azathioprine] Swelling    Join pain , rash   Remicade [Infliximab] Other (See Comments)    Joint pain   Stelara  [Ustekinumab  (Murine)] Hives     Patient was a new start to Stelara , rec'd the Stelara  IV on 11/09/2017. He developed hives and was given 30 mg of Prednisone . Per patient after being given the Prednisone  he noted additional hives,Stelara  has been discontinued.   Adalimumab Hives   Ppd [Tuberculin Purified Protein Derivative] Hives and Swelling    Joint pain. Patient rec'd both this and Imuran, not sure which caused this.    Medications: Current Outpatient Medications  Medication Sig Dispense Refill   Ascorbic Acid (VITAMIN C) 500 MG CHEW Chew 500 mg by mouth daily.     cholecalciferol (VITAMIN D3) 25 MCG (1000 UNIT) tablet Take 1,000 Units by mouth daily.     GARLIC PO Take 3,600 mg by mouth daily.     melatonin 3 MG TABS tablet Take 3 mg by mouth at bedtime.     Milk Thistle 1000 MG CAPS Take 1,000 mg by mouth daily.     nystatin -triamcinolone  (MYCOLOG II) cream Apply 1 application topically 2 (two) times daily. (Patient taking differently: Apply 1 application  topically 2 (two) times daily as needed (irritation).) 30 g 1   Omega-3 Fatty Acids (FISH OIL PO) Take 1 capsule by mouth daily. Blend     OVER THE COUNTER MEDICATION Take 1 capsule by mouth daily. Prostavan     Probiotic Product (PROBIOTIC DAILY PO) Take  30 mg by mouth daily. 2     Red Yeast Rice Extract (RED YEAST RICE PO) Take 1,200 mg by mouth daily.     rosuvastatin (CRESTOR) 5 MG tablet Take 5 mg by mouth daily.     No current facility-administered medications for this visit.    Review of Systems: GENERAL: negative for malaise, night sweats HEENT: No changes in hearing or vision, no nose bleeds or other nasal problems. NECK: Negative for lumps, goiter, pain and significant neck swelling RESPIRATORY: Negative for cough, wheezing CARDIOVASCULAR: Negative for chest pain, leg swelling, palpitations, orthopnea GI: SEE HPI MUSCULOSKELETAL: Negative for joint pain or swelling, back pain, and muscle pain. SKIN: Negative for lesions, rash PSYCH: Negative for  sleep disturbance, mood disorder and recent psychosocial stressors. HEMATOLOGY Negative for prolonged bleeding, bruising easily, and swollen nodes. ENDOCRINE: Negative for cold or heat intolerance, polyuria, polydipsia and goiter. NEURO: negative for tremor, gait imbalance, syncope and seizures. The remainder of the review of systems is noncontributory.   Physical Exam: BP 108/73   Pulse 69   Temp 97.8 F (36.6 C)   Ht 6' 2 (1.88 m)   Wt 201 lb 3.2 oz (91.3 kg)   BMI 25.83 kg/m  GENERAL: The patient is AO x3, in no acute distress. HEENT: Head is normocephalic and atraumatic. EOMI are intact. Mouth is well hydrated and without lesions. NECK: Supple. No masses LUNGS: Clear to auscultation. No presence of rhonchi/wheezing/rales. Adequate chest expansion HEART: RRR, normal s1 and s2. ABDOMEN: Soft, nontender, no guarding, no peritoneal signs, and nondistended. BS +. No masses. RECTAL EXAM: Presence of 2 perianal fistulas in right gluteal/perianal area, 1 with active purulent drainage but no tenderness EXTREMITIES: Without any cyanosis, clubbing, rash, lesions or edema. NEUROLOGIC: AOx3, no focal motor deficit. SKIN: no jaundice, no rashes  Imaging/Labs: as above  I personally reviewed and interpreted the available labs, imaging and endoscopic files.  Impression and Plan: Cory [name] is a 63 y.o. male coming for follow up of ***   All questions were answered.      Toribio Fortune, MD Gastroenterology and Hepatology [facility]

## 2023-12-31 NOTE — Patient Instructions (Addendum)
 Schedule MR pelvis in 6 weeks Start ciprofloxacin  and Flagyl  for 3 more weeks Continue with daily use of perianal concoction May need to discuss blood based colorectal cancer screening testing once available in the market

## 2024-01-30 ENCOUNTER — Telehealth: Payer: Self-pay

## 2024-01-30 NOTE — Telephone Encounter (Signed)
 Return call to patient. Pt state's he want to scheduled an appointment and establish care. Pt is made aware to contact his PCP for a referral to our office.

## 2024-02-12 ENCOUNTER — Ambulatory Visit (HOSPITAL_COMMUNITY)
Admission: RE | Admit: 2024-02-12 | Discharge: 2024-02-12 | Disposition: A | Discharge status: Home / Self Care | Source: Ambulatory Visit | Attending: Gastroenterology | Admitting: Gastroenterology

## 2024-02-12 DIAGNOSIS — K50118 Crohn's disease of large intestine with other complication: Secondary | ICD-10-CM | POA: Diagnosis present

## 2024-02-12 DIAGNOSIS — K603 Anal fistula, unspecified: Secondary | ICD-10-CM | POA: Insufficient documentation

## 2024-02-12 DIAGNOSIS — K50113 Crohn's disease of large intestine with fistula: Secondary | ICD-10-CM | POA: Insufficient documentation

## 2024-02-12 MED ORDER — GADOBUTROL 1 MMOL/ML IV SOLN
9.0000 mL | Freq: Once | INTRAVENOUS | Status: AC | PRN
  Administered 2024-02-12: 9 mL via INTRAVENOUS

## 2024-02-18 ENCOUNTER — Other Ambulatory Visit (INDEPENDENT_AMBULATORY_CARE_PROVIDER_SITE_OTHER): Payer: Self-pay | Admitting: Gastroenterology

## 2024-02-18 DIAGNOSIS — K603 Anal fistula, unspecified: Secondary | ICD-10-CM

## 2024-02-18 MED ORDER — CIPROFLOXACIN HCL 500 MG PO TABS: 500.0000 mg | ORAL_TABLET | Freq: Two times a day (BID) | ORAL | 0 refills | Status: AC

## 2024-02-18 MED ORDER — METRONIDAZOLE 500 MG PO TABS: 500.0000 mg | ORAL_TABLET | Freq: Three times a day (TID) | ORAL | 0 refills | Status: AC

## 2024-02-20 ENCOUNTER — Telehealth: Payer: Self-pay | Admitting: Pediatrics

## 2024-02-21 NOTE — Telephone Encounter (Signed)
 Good afternoon Dr. Suzann,   Patient is wishing to transfer his care over to you directly. He is wishing to be seen for Crohn's and colonoscpy. Patient is currently scheduled for 2/4 at [facility] for colonoscopy. States he wishes transfer due to previous provider with [facility] moving location. He also states his Wife works at [employer] and a animator of hers is familiar with you and recommended you. Patient's previous records are available in Epic for you to review and advise on scheduling.   Thank you

## 2024-02-27 ENCOUNTER — Ambulatory Visit: Admitting: Urology

## 2024-02-28 NOTE — Telephone Encounter (Signed)
 Left patient a voicemail to call back to discuss previous note. It seems patient has canceled 2/4 procedure.

## 2024-03-05 ENCOUNTER — Ambulatory Visit (INDEPENDENT_AMBULATORY_CARE_PROVIDER_SITE_OTHER): Admitting: Gastroenterology

## 2024-03-28 ENCOUNTER — Ambulatory Visit: Admitting: Pediatrics

## 2024-04-23 ENCOUNTER — Ambulatory Visit: Admitting: Urology
